# Patient Record
Sex: Female | Born: 1950 | Race: Black or African American | Hispanic: No | Marital: Married | Smoking: Never smoker
Health system: Southern US, Community
[De-identification: ages and names within clinical notes are randomized; demographics above are authoritative.]

## PROBLEM LIST (undated history)

## (undated) ENCOUNTER — Emergency Department (HOSPITAL_COMMUNITY): Discharge status: Against Medical Advice | Payer: Self-pay

## (undated) DIAGNOSIS — I10 Essential (primary) hypertension: Secondary | ICD-10-CM

## (undated) DIAGNOSIS — K219 Gastro-esophageal reflux disease without esophagitis: Secondary | ICD-10-CM

## (undated) DIAGNOSIS — M1990 Unspecified osteoarthritis, unspecified site: Secondary | ICD-10-CM

## (undated) DIAGNOSIS — M797 Fibromyalgia: Secondary | ICD-10-CM

## (undated) DIAGNOSIS — K56609 Unspecified intestinal obstruction, unspecified as to partial versus complete obstruction: Secondary | ICD-10-CM

## (undated) DIAGNOSIS — C801 Malignant (primary) neoplasm, unspecified: Secondary | ICD-10-CM

## (undated) DIAGNOSIS — Z95828 Presence of other vascular implants and grafts: Secondary | ICD-10-CM

## (undated) DIAGNOSIS — E119 Type 2 diabetes mellitus without complications: Secondary | ICD-10-CM

## (undated) HISTORY — PX: BACK SURGERY: SHX140

## (undated) HISTORY — PX: ABDOMINAL HYSTERECTOMY: SHX81

---

## 2006-03-30 ENCOUNTER — Inpatient Hospital Stay (HOSPITAL_COMMUNITY): Admission: RE | Admit: 2006-03-30 | Discharge: 2006-04-01 | Payer: Self-pay | Admitting: Specialist

## 2006-05-23 ENCOUNTER — Inpatient Hospital Stay (HOSPITAL_COMMUNITY): Admission: RE | Admit: 2006-05-23 | Discharge: 2006-05-28 | Payer: Self-pay | Admitting: Specialist

## 2008-02-08 DIAGNOSIS — Z95828 Presence of other vascular implants and grafts: Secondary | ICD-10-CM

## 2008-02-08 HISTORY — DX: Presence of other vascular implants and grafts: Z95.828

## 2008-02-19 ENCOUNTER — Encounter: Admission: RE | Admit: 2008-02-19 | Discharge: 2008-02-19 | Payer: Self-pay | Admitting: Specialist

## 2008-03-06 ENCOUNTER — Ambulatory Visit: Payer: Self-pay | Admitting: *Deleted

## 2008-04-29 ENCOUNTER — Inpatient Hospital Stay (HOSPITAL_COMMUNITY): Admission: RE | Admit: 2008-04-29 | Discharge: 2008-05-07 | Payer: Self-pay | Admitting: Specialist

## 2008-04-29 ENCOUNTER — Ambulatory Visit: Payer: Self-pay | Admitting: *Deleted

## 2008-05-05 ENCOUNTER — Ambulatory Visit: Payer: Self-pay | Admitting: Physical Medicine & Rehabilitation

## 2008-05-16 ENCOUNTER — Inpatient Hospital Stay (HOSPITAL_COMMUNITY): Admission: EM | Admit: 2008-05-16 | Discharge: 2008-05-26 | Payer: Self-pay | Admitting: Specialist

## 2008-05-17 ENCOUNTER — Encounter (INDEPENDENT_AMBULATORY_CARE_PROVIDER_SITE_OTHER): Payer: Self-pay | Admitting: Surgery

## 2008-10-14 ENCOUNTER — Encounter: Admission: RE | Admit: 2008-10-14 | Discharge: 2008-10-14 | Payer: Self-pay | Admitting: Specialist

## 2010-05-19 LAB — GLUCOSE, CAPILLARY
Glucose-Capillary: 119 mg/dL — ABNORMAL HIGH (ref 70–99)
Glucose-Capillary: 122 mg/dL — ABNORMAL HIGH (ref 70–99)
Glucose-Capillary: 131 mg/dL — ABNORMAL HIGH (ref 70–99)
Glucose-Capillary: 147 mg/dL — ABNORMAL HIGH (ref 70–99)
Glucose-Capillary: 169 mg/dL — ABNORMAL HIGH (ref 70–99)
Glucose-Capillary: 172 mg/dL — ABNORMAL HIGH (ref 70–99)
Glucose-Capillary: 173 mg/dL — ABNORMAL HIGH (ref 70–99)
Glucose-Capillary: 175 mg/dL — ABNORMAL HIGH (ref 70–99)
Glucose-Capillary: 186 mg/dL — ABNORMAL HIGH (ref 70–99)
Glucose-Capillary: 122 mg/dL — ABNORMAL HIGH (ref 70–99)
Glucose-Capillary: 138 mg/dL — ABNORMAL HIGH (ref 70–99)
Glucose-Capillary: 138 mg/dL — ABNORMAL HIGH (ref 70–99)
Glucose-Capillary: 147 mg/dL — ABNORMAL HIGH (ref 70–99)
Glucose-Capillary: 159 mg/dL — ABNORMAL HIGH (ref 70–99)
Glucose-Capillary: 160 mg/dL — ABNORMAL HIGH (ref 70–99)
Glucose-Capillary: 162 mg/dL — ABNORMAL HIGH (ref 70–99)
Glucose-Capillary: 187 mg/dL — ABNORMAL HIGH (ref 70–99)
Glucose-Capillary: 187 mg/dL — ABNORMAL HIGH (ref 70–99)
Glucose-Capillary: 214 mg/dL — ABNORMAL HIGH (ref 70–99)
Glucose-Capillary: 221 mg/dL — ABNORMAL HIGH (ref 70–99)

## 2010-05-19 LAB — CBC
HCT: 24.9 % — ABNORMAL LOW (ref 36.0–46.0)
HCT: 27.3 % — ABNORMAL LOW (ref 36.0–46.0)
HCT: 27.8 % — ABNORMAL LOW (ref 36.0–46.0)
HCT: 27.9 % — ABNORMAL LOW (ref 36.0–46.0)
HCT: 30.4 % — ABNORMAL LOW (ref 36.0–46.0)
HCT: 36 % (ref 36.0–46.0)
Hemoglobin: 8.4 g/dL — ABNORMAL LOW (ref 12.0–15.0)
Hemoglobin: 9.3 g/dL — ABNORMAL LOW (ref 12.0–15.0)
Hemoglobin: 9.5 g/dL — ABNORMAL LOW (ref 12.0–15.0)
Hemoglobin: 10.3 g/dL — ABNORMAL LOW (ref 12.0–15.0)
Hemoglobin: 10.4 g/dL — ABNORMAL LOW (ref 12.0–15.0)
Hemoglobin: 12 g/dL (ref 12.0–15.0)
MCHC: 34.2 g/dL (ref 30.0–36.0)
MCHC: 34.2 g/dL (ref 30.0–36.0)
MCHC: 33.2 g/dL (ref 30.0–36.0)
MCHC: 33.9 g/dL (ref 30.0–36.0)
MCHC: 34 g/dL (ref 30.0–36.0)
MCV: 87.8 fL (ref 78.0–100.0)
MCV: 88.4 fL (ref 78.0–100.0)
MCV: 88.7 fL (ref 78.0–100.0)
MCV: 87.8 fL (ref 78.0–100.0)
MCV: 88.4 fL (ref 78.0–100.0)
MCV: 89.7 fL (ref 78.0–100.0)
Platelets: 146 10*3/uL — ABNORMAL LOW (ref 150–400)
Platelets: 146 10*3/uL — ABNORMAL LOW (ref 150–400)
Platelets: 171 10*3/uL (ref 150–400)
Platelets: 252 10*3/uL (ref 150–400)
RBC: 2.8 MIL/uL — ABNORMAL LOW (ref 3.87–5.11)
RBC: 3.16 MIL/uL — ABNORMAL LOW (ref 3.87–5.11)
RBC: 3.17 MIL/uL — ABNORMAL LOW (ref 3.87–5.11)
RBC: 3.48 MIL/uL — ABNORMAL LOW (ref 3.87–5.11)
RBC: 3.53 MIL/uL — ABNORMAL LOW (ref 3.87–5.11)
RBC: 3.84 MIL/uL — ABNORMAL LOW (ref 3.87–5.11)
RBC: 4.01 MIL/uL (ref 3.87–5.11)
RDW: 15 % (ref 11.5–15.5)
RDW: 15.1 % (ref 11.5–15.5)
RDW: 15.5 % (ref 11.5–15.5)
WBC: 7.9 10*3/uL (ref 4.0–10.5)
WBC: 8.1 10*3/uL (ref 4.0–10.5)
WBC: 8.6 10*3/uL (ref 4.0–10.5)
WBC: 8.7 10*3/uL (ref 4.0–10.5)
WBC: 11.5 10*3/uL — ABNORMAL HIGH (ref 4.0–10.5)
WBC: 13.2 10*3/uL — ABNORMAL HIGH (ref 4.0–10.5)
WBC: 7.7 10*3/uL (ref 4.0–10.5)
WBC: 7.8 10*3/uL (ref 4.0–10.5)
WBC: 8.9 10*3/uL (ref 4.0–10.5)

## 2010-05-19 LAB — PROTIME-INR
INR: 1.3 (ref 0.00–1.49)
INR: 1.9 — ABNORMAL HIGH (ref 0.00–1.49)
Prothrombin Time: 15.4 seconds — ABNORMAL HIGH (ref 11.6–15.2)
Prothrombin Time: 16.7 seconds — ABNORMAL HIGH (ref 11.6–15.2)

## 2010-05-19 LAB — BASIC METABOLIC PANEL
BUN: 9 mg/dL (ref 6–23)
CO2: 26 mEq/L (ref 19–32)
CO2: 25 mEq/L (ref 19–32)
Calcium: 8.3 mg/dL — ABNORMAL LOW (ref 8.4–10.5)
Calcium: 8 mg/dL — ABNORMAL LOW (ref 8.4–10.5)
Chloride: 105 mEq/L (ref 96–112)
Creatinine, Ser: 0.59 mg/dL (ref 0.4–1.2)
Creatinine, Ser: 0.58 mg/dL (ref 0.4–1.2)
GFR calc Af Amer: >60 mL/min (ref >60)
GFR calc Af Amer: >60 mL/min (ref >60)
GFR calc Af Amer: >60 mL/min (ref >60)
GFR calc non Af Amer: >60 mL/min (ref >60)
GFR calc non Af Amer: >60 mL/min (ref >60)
Glucose, Bld: 169 mg/dL — ABNORMAL HIGH (ref 70–99)
Potassium: 4 mEq/L (ref 3.5–5.1)
Potassium: 4.6 mEq/L (ref 3.5–5.1)
Potassium: 3.5 mEq/L (ref 3.5–5.1)
Sodium: 138 mEq/L (ref 135–145)
Sodium: 138 mEq/L (ref 135–145)
Sodium: 139 mEq/L (ref 135–145)

## 2010-05-19 LAB — DIFFERENTIAL
Basophils Absolute: 0 10*3/uL (ref 0.0–0.1)
Basophils Absolute: 0.1 10*3/uL (ref 0.0–0.1)
Basophils Relative: 0 % (ref 0–1)
Basophils Relative: 0 % (ref 0–1)
Eosinophils Absolute: 0.1 10*3/uL (ref 0.0–0.7)
Eosinophils Absolute: 0.2 10*3/uL (ref 0.0–0.7)
Eosinophils Relative: 1 % (ref 0–5)
Lymphocytes Relative: 13 % (ref 12–46)
Monocytes Absolute: 0.7 10*3/uL (ref 0.1–1.0)
Monocytes Relative: 6 % (ref 3–12)
Monocytes Relative: 8 % (ref 3–12)
Neutro Abs: 10.8 10*3/uL — ABNORMAL HIGH (ref 1.7–7.7)
Neutro Abs: 9.8 10*3/uL — ABNORMAL HIGH (ref 1.7–7.7)
Neutrophils Relative %: 78 % — ABNORMAL HIGH (ref 43–77)

## 2010-05-19 LAB — COMPREHENSIVE METABOLIC PANEL
ALT: 40 U/L — ABNORMAL HIGH (ref 0–35)
ALT: 45 U/L — ABNORMAL HIGH (ref 0–35)
ALT: 63 U/L — ABNORMAL HIGH (ref 0–35)
AST: 19 U/L (ref 0–37)
AST: 24 U/L (ref 0–37)
AST: 27 U/L (ref 0–37)
AST: 44 U/L — ABNORMAL HIGH (ref 0–37)
Albumin: 2.3 g/dL — ABNORMAL LOW (ref 3.5–5.2)
Albumin: 2.8 g/dL — ABNORMAL LOW (ref 3.5–5.2)
Alkaline Phosphatase: 102 U/L (ref 39–117)
Alkaline Phosphatase: 120 U/L — ABNORMAL HIGH (ref 39–117)
Alkaline Phosphatase: 122 U/L — ABNORMAL HIGH (ref 39–117)
BUN: 10 mg/dL (ref 6–23)
CO2: 25 mEq/L (ref 19–32)
CO2: 25 mEq/L (ref 19–32)
CO2: 27 mEq/L (ref 19–32)
CO2: 27 mEq/L (ref 19–32)
Calcium: 8.6 mg/dL (ref 8.4–10.5)
Chloride: 101 mEq/L (ref 96–112)
Chloride: 101 mEq/L (ref 96–112)
Chloride: 108 mEq/L (ref 96–112)
Chloride: 110 mEq/L (ref 96–112)
Creatinine, Ser: 0.51 mg/dL (ref 0.4–1.2)
Creatinine, Ser: 0.56 mg/dL (ref 0.4–1.2)
Creatinine, Ser: 0.77 mg/dL (ref 0.4–1.2)
GFR calc Af Amer: >60 mL/min (ref >60)
GFR calc Af Amer: >60 mL/min (ref >60)
GFR calc Af Amer: >60 mL/min (ref >60)
GFR calc Af Amer: >60 mL/min (ref >60)
GFR calc non Af Amer: >60 mL/min (ref >60)
GFR calc non Af Amer: >60 mL/min (ref >60)
GFR calc non Af Amer: >60 mL/min (ref >60)
GFR calc non Af Amer: >60 mL/min (ref >60)
Glucose, Bld: 133 mg/dL — ABNORMAL HIGH (ref 70–99)
Glucose, Bld: 151 mg/dL — ABNORMAL HIGH (ref 70–99)
Glucose, Bld: 168 mg/dL — ABNORMAL HIGH (ref 70–99)
Potassium: 3.4 mEq/L — ABNORMAL LOW (ref 3.5–5.1)
Potassium: 3.4 mEq/L — ABNORMAL LOW (ref 3.5–5.1)
Potassium: 3.7 mEq/L (ref 3.5–5.1)
Sodium: 136 mEq/L (ref 135–145)
Sodium: 137 mEq/L (ref 135–145)
Total Bilirubin: 0.4 mg/dL (ref 0.3–1.2)
Total Bilirubin: 0.5 mg/dL (ref 0.3–1.2)
Total Bilirubin: 0.6 mg/dL (ref 0.3–1.2)
Total Bilirubin: 0.8 mg/dL (ref 0.3–1.2)
Total Protein: 6.6 g/dL (ref 6.0–8.3)

## 2010-05-19 LAB — LIPASE, BLOOD: Lipase: 25 U/L (ref 11–59)

## 2010-05-19 LAB — URINE MICROSCOPIC-ADD ON

## 2010-05-19 LAB — HEPARIN LEVEL (UNFRACTIONATED)
Heparin Unfractionated: 0.13 IU/mL — ABNORMAL LOW (ref 0.30–0.70)
Heparin Unfractionated: 0.2 IU/mL — ABNORMAL LOW (ref 0.30–0.70)
Heparin Unfractionated: 0.32 IU/mL (ref 0.30–0.70)
Heparin Unfractionated: 0.42 IU/mL (ref 0.30–0.70)
Heparin Unfractionated: 0.66 IU/mL (ref 0.30–0.70)
Heparin Unfractionated: <0.1 IU/mL — ABNORMAL LOW (ref 0.30–0.70)

## 2010-05-19 LAB — URINALYSIS, ROUTINE W REFLEX MICROSCOPIC
Bilirubin Urine: NEGATIVE
Glucose, UA: NEGATIVE mg/dL
Hgb urine dipstick: NEGATIVE
Nitrite: NEGATIVE
Specific Gravity, Urine: >1.046 — ABNORMAL HIGH (ref 1.005–1.030)
pH: 7 (ref 5.0–8.0)

## 2010-05-19 LAB — TRIGLYCERIDES: Triglycerides: 66 mg/dL (ref <150)

## 2010-05-19 LAB — TYPE AND SCREEN: Antibody Screen: NEGATIVE

## 2010-05-19 LAB — PHOSPHORUS
Phosphorus: 2.7 mg/dL (ref 2.3–4.6)
Phosphorus: 3.3 mg/dL (ref 2.3–4.6)

## 2010-05-20 LAB — GLUCOSE, CAPILLARY
Glucose-Capillary: 103 mg/dL — ABNORMAL HIGH (ref 70–99)
Glucose-Capillary: 107 mg/dL — ABNORMAL HIGH (ref 70–99)
Glucose-Capillary: 119 mg/dL — ABNORMAL HIGH (ref 70–99)
Glucose-Capillary: 128 mg/dL — ABNORMAL HIGH (ref 70–99)
Glucose-Capillary: 135 mg/dL — ABNORMAL HIGH (ref 70–99)
Glucose-Capillary: 135 mg/dL — ABNORMAL HIGH (ref 70–99)
Glucose-Capillary: 136 mg/dL — ABNORMAL HIGH (ref 70–99)
Glucose-Capillary: 136 mg/dL — ABNORMAL HIGH (ref 70–99)
Glucose-Capillary: 137 mg/dL — ABNORMAL HIGH (ref 70–99)
Glucose-Capillary: 137 mg/dL — ABNORMAL HIGH (ref 70–99)
Glucose-Capillary: 138 mg/dL — ABNORMAL HIGH (ref 70–99)
Glucose-Capillary: 145 mg/dL — ABNORMAL HIGH (ref 70–99)
Glucose-Capillary: 145 mg/dL — ABNORMAL HIGH (ref 70–99)
Glucose-Capillary: 151 mg/dL — ABNORMAL HIGH (ref 70–99)
Glucose-Capillary: 73 mg/dL (ref 70–99)

## 2010-05-20 LAB — TYPE AND SCREEN: Antibody Screen: NEGATIVE

## 2010-05-20 LAB — CBC
HCT: 27.6 % — ABNORMAL LOW (ref 36.0–46.0)
HCT: 27.7 % — ABNORMAL LOW (ref 36.0–46.0)
HCT: 29 % — ABNORMAL LOW (ref 36.0–46.0)
HCT: 30.6 % — ABNORMAL LOW (ref 36.0–46.0)
Hemoglobin: 11 g/dL — ABNORMAL LOW (ref 12.0–15.0)
Hemoglobin: 9.3 g/dL — ABNORMAL LOW (ref 12.0–15.0)
Hemoglobin: 9.5 g/dL — ABNORMAL LOW (ref 12.0–15.0)
MCHC: 34.4 g/dL (ref 30.0–36.0)
MCHC: 35.3 g/dL (ref 30.0–36.0)
MCV: 87.8 fL (ref 78.0–100.0)
MCV: 88 fL (ref 78.0–100.0)
MCV: 90.9 fL (ref 78.0–100.0)
Platelets: 130 10*3/uL — ABNORMAL LOW (ref 150–400)
Platelets: 130 10*3/uL — ABNORMAL LOW (ref 150–400)
Platelets: 202 10*3/uL (ref 150–400)
Platelets: 251 10*3/uL (ref 150–400)
RBC: 3.09 MIL/uL — ABNORMAL LOW (ref 3.87–5.11)
RBC: 3.15 MIL/uL — ABNORMAL LOW (ref 3.87–5.11)
RBC: 3.28 MIL/uL — ABNORMAL LOW (ref 3.87–5.11)
RBC: 3.36 MIL/uL — ABNORMAL LOW (ref 3.87–5.11)
RBC: 3.54 MIL/uL — ABNORMAL LOW (ref 3.87–5.11)
RDW: 12.7 % (ref 11.5–15.5)
RDW: 13.2 % (ref 11.5–15.5)
RDW: 14.8 % (ref 11.5–15.5)
RDW: 15 % (ref 11.5–15.5)
WBC: 12.3 10*3/uL — ABNORMAL HIGH (ref 4.0–10.5)
WBC: 12.4 10*3/uL — ABNORMAL HIGH (ref 4.0–10.5)
WBC: 12.4 10*3/uL — ABNORMAL HIGH (ref 4.0–10.5)
WBC: 8.9 10*3/uL (ref 4.0–10.5)
WBC: 9.3 10*3/uL (ref 4.0–10.5)

## 2010-05-20 LAB — COMPREHENSIVE METABOLIC PANEL
ALT: 15 U/L (ref 0–35)
ALT: 22 U/L (ref 0–35)
AST: 21 U/L (ref 0–37)
AST: 21 U/L (ref 0–37)
AST: 22 U/L (ref 0–37)
Albumin: 1.9 g/dL — ABNORMAL LOW (ref 3.5–5.2)
Alkaline Phosphatase: 113 U/L (ref 39–117)
BUN: 10 mg/dL (ref 6–23)
CO2: 28 mEq/L (ref 19–32)
Calcium: 7.7 mg/dL — ABNORMAL LOW (ref 8.4–10.5)
Chloride: 102 mEq/L (ref 96–112)
Chloride: 104 mEq/L (ref 96–112)
Creatinine, Ser: 0.72 mg/dL (ref 0.4–1.2)
GFR calc Af Amer: 59 mL/min — ABNORMAL LOW (ref >60)
GFR calc Af Amer: >60 mL/min (ref >60)
GFR calc Af Amer: >60 mL/min (ref >60)
GFR calc non Af Amer: 49 mL/min — ABNORMAL LOW (ref >60)
Glucose, Bld: 141 mg/dL — ABNORMAL HIGH (ref 70–99)
Glucose, Bld: 84 mg/dL (ref 70–99)
Sodium: 135 mEq/L (ref 135–145)
Sodium: 140 mEq/L (ref 135–145)
Total Bilirubin: 0.4 mg/dL (ref 0.3–1.2)
Total Protein: 4 g/dL — ABNORMAL LOW (ref 6.0–8.3)
Total Protein: 5.3 g/dL — ABNORMAL LOW (ref 6.0–8.3)

## 2010-05-20 LAB — BASIC METABOLIC PANEL
BUN: 8 mg/dL (ref 6–23)
CO2: 26 mEq/L (ref 19–32)
CO2: 27 mEq/L (ref 19–32)
Calcium: 7.6 mg/dL — ABNORMAL LOW (ref 8.4–10.5)
Calcium: 7.6 mg/dL — ABNORMAL LOW (ref 8.4–10.5)
Calcium: 7.7 mg/dL — ABNORMAL LOW (ref 8.4–10.5)
Chloride: 102 mEq/L (ref 96–112)
Chloride: 103 mEq/L (ref 96–112)
Chloride: 106 mEq/L (ref 96–112)
Creatinine, Ser: 0.68 mg/dL (ref 0.4–1.2)
Creatinine, Ser: 0.78 mg/dL (ref 0.4–1.2)
Creatinine, Ser: 0.78 mg/dL (ref 0.4–1.2)
GFR calc Af Amer: >60 mL/min (ref >60)
GFR calc Af Amer: >60 mL/min (ref >60)
GFR calc non Af Amer: >60 mL/min (ref >60)
GFR calc non Af Amer: >60 mL/min (ref >60)
Glucose, Bld: 123 mg/dL — ABNORMAL HIGH (ref 70–99)
Glucose, Bld: 221 mg/dL — ABNORMAL HIGH (ref 70–99)
Potassium: 3.8 mEq/L (ref 3.5–5.1)
Potassium: 4 mEq/L (ref 3.5–5.1)
Sodium: 133 mEq/L — ABNORMAL LOW (ref 135–145)
Sodium: 135 mEq/L (ref 135–145)
Sodium: 139 mEq/L (ref 135–145)

## 2010-05-20 LAB — DIFFERENTIAL
Basophils Absolute: 0.1 10*3/uL (ref 0.0–0.1)
Basophils Absolute: 0.1 10*3/uL (ref 0.0–0.1)
Basophils Relative: 0 % (ref 0–1)
Basophils Relative: 1 % (ref 0–1)
Eosinophils Absolute: 0.2 10*3/uL (ref 0.0–0.7)
Eosinophils Relative: 2 % (ref 0–5)
Monocytes Relative: 6 % (ref 3–12)
Neutro Abs: 8.4 10*3/uL — ABNORMAL HIGH (ref 1.7–7.7)
Neutrophils Relative %: 56 % (ref 43–77)
Neutrophils Relative %: 78 % — ABNORMAL HIGH (ref 43–77)

## 2010-05-20 LAB — CROSSMATCH: Antibody Screen: NEGATIVE

## 2010-05-20 LAB — URINE MICROSCOPIC-ADD ON

## 2010-05-20 LAB — PROTIME-INR: Prothrombin Time: 13.5 seconds (ref 11.6–15.2)

## 2010-05-20 LAB — URINALYSIS, ROUTINE W REFLEX MICROSCOPIC
Bilirubin Urine: NEGATIVE
Glucose, UA: NEGATIVE mg/dL
Hgb urine dipstick: NEGATIVE
Hgb urine dipstick: NEGATIVE
Ketones, ur: NEGATIVE mg/dL
Specific Gravity, Urine: 1.022 (ref 1.005–1.030)
Urobilinogen, UA: 1 mg/dL (ref 0.0–1.0)
pH: 6 (ref 5.0–8.0)

## 2010-05-20 LAB — POCT I-STAT 7, (LYTES, BLD GAS, ICA,H+H)
HCT: 22 % — ABNORMAL LOW (ref 36.0–46.0)
pCO2 arterial: 37 mmHg (ref 35.0–45.0)
pH, Arterial: 7.465 — ABNORMAL HIGH (ref 7.350–7.400)

## 2010-05-20 LAB — HEMOGLOBIN AND HEMATOCRIT, BLOOD
HCT: 24.8 % — ABNORMAL LOW (ref 36.0–46.0)
Hemoglobin: 8.6 g/dL — ABNORMAL LOW (ref 12.0–15.0)

## 2010-05-20 LAB — URINE CULTURE

## 2010-05-20 LAB — GRAM STAIN

## 2010-05-20 LAB — HEMOGLOBIN A1C: Hgb A1c MFr Bld: 6 % (ref 4.6–6.1)

## 2010-05-20 LAB — ANAEROBIC CULTURE

## 2010-05-20 LAB — HEMATOCRIT: HCT: 24.3 % — ABNORMAL LOW (ref 36.0–46.0)

## 2010-06-22 NOTE — Op Note (Signed)
NAMEDELYNN, OLVERA NO.:  [id number]   MEDICAL RECORD NO.:  [id number]          PATIENT TYPE:  INP   LOCATION:  5154                         FACILITY:  [facility]   PHYSICIAN:  Velora Heckler, MD      DATE OF BIRTH:  [birth year]   DATE OF PROCEDURE:  05/22/2008  DATE OF DISCHARGE:                               OPERATIVE REPORT   PREOPERATIVE DIAGNOSIS:  Small-bowel obstruction.   POSTOPERATIVE DIAGNOSIS:  Small-bowel obstruction.   PROCEDURES:  1. Diagnostic laparoscopy.  2. Laparoscopic lysis of adhesions.   SURGEON:  Velora Heckler, MD, FACS   ASSISTANT:  Gabrielle Dare. Janee Morn, MD, and Kelle Darting. Rennis Harding, NP.   ANESTHESIA:  General per Dr. Claybon Jabs.   ESTIMATED BLOOD LOSS:  Minimal.   PREPARATION:  ChloraPrep.   COMPLICATIONS:  None.   INDICATIONS:  The patient is a 60 year old black female who had  undergone preperitoneal approach to anterior spine fusion by Dr. Vira Browns 3 weeks ago.  Postoperatively, she developed signs and symptoms of  small-bowel obstruction.  Despite conservative measures, she failed to  resolve.  The patient now comes to the operating room for laparoscopy  and management of small-bowel obstruction.   BODY OF REPORT:  Procedure was done in OR #70 at [facility].  The patient was brought to the operating room, placed in  supine position on the operating room table.  Following administration  of general anesthesia, the patient was positioned and then prepped and  draped in usual strict aseptic fashion.  After ascertaining that an  adequate level of anesthesia had been achieved, a 2-cm incision was made  in the right upper quadrant of the abdominal wall.  Using an OptiVu  trocar, the laparoscope was advanced into the peritoneal cavity under  direct vision.  Abdomen was then insufflated with carbon dioxide.  Laparoscope was introduced and the abdomen was explored.  Operative  ports were placed in the right lower  quadrant and suprapubic position  with 5-mm trocars.  There was a defect in the peritoneum on the left  side of the mid to lower abdomen.  Omentum was incarcerated and adhesed.  This was gently taken down with blunt dissection.  Beneath this defect  was a second defect just lateral to the descending colon.  This  contained incarcerated small bowel.  Using the EndoShears, the  peritoneum was incised and the band across the small bowel was released.  The small bowel loop was in the preperitoneal space.  It was reduced  back within the peritoneal cavity.  The remainder of the small bowel was  then freed from the edge of the peritoneum with sharp dissection with  the EndoShears providing complete release of the small bowel from what  was essentially an internal hernia through the peritoneum.  The  peritoneum was then re-affixed to the abdominal wall with several  [employer] titanium ProTacks.  Representative photographs were made for the  medical record.   Small bowel was run between [place] clamps and no evidence of  enterotomy was identified.  Left colon appears normal.  Fluid was  evacuated.  Pneumoperitoneum was released.  Ports were removed under  direct vision and good hemostasis was noted at all port sites.  Ports  were removed.  Wounds were anesthetized with local anesthetic.  Skin  edges were reapproximated with interrupted 4-0 Vicryl subcuticular  sutures.  Wounds were washed and dried and Benzoin and Steri-Strips were  applied.  Sterile dressings were applied.  The patient was awakened from  anesthesia and brought to the recovery room in stable condition.  The  patient tolerated the procedure well.      Velora Heckler, MD  Electronically Signed     TMG/MEDQ  D:  05/22/2008  T:  05/23/2008  Job:  [id number]   cc:   [name], M.D.  [name], M.D.

## 2010-06-22 NOTE — Op Note (Signed)
[name], [name]            ACCOUNT NO.:  [id number]   MEDICAL RECORD NO.:  [id number]          PATIENT TYPE:  INP   LOCATION:  3312                         FACILITY:  [facility]   PHYSICIAN:  [name], M.D.   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  Degenerative  disk disease L3-4 and L5-S1.   POSTOPERATIVE DIAGNOSES:  Nonunion transforaminal lumbar interbody  fusion L4-5 with loosening of posterior pedicle screws and rods,  degenerative disk disease L3-4 and L5-S1 with lateral recess stenosis  and foraminal entrapment at the L3-4 and L4-5 levels.  Abundant  posterior scar material.  The patient's anterior transforaminal lumbar  interbody fusion PEEK cage had displaced anteriorly and with supine  position reduced allowing for removal anteriorly.  Over time erosion  over the superior anterior lip of L5 had occurred.   PROCEDURE:  Basically, anterior and posterior procedures of the lumbar  spine.  Anterior lumbar interbody fusion performed with removal of a  PEEK cage anteriorly with takedown of nonunion and anterior lumbar  interbody fusion using a 19-mm Synthes PEEK cage with combination of  left iliac crest bone graft harvested through a separate incision,  Infuse small kit one-half of one gel pad with Vitoss and bone marrow  aspirate from the left anterior iliac crest.  Internal fixation of the  ALIF cage using a 25-mm 6.5 cancellous screw with single washer into the  superior endplate of L5 holding the cage in place.  Tisseel used to seal  off the anterior aspect of the anterior interbody fusion site from  retroperitoneum.  VAC application to the anterior lumbar incision site  as well as the anterior iliac crest bone graft harvest site incision.  Second portion of the procedure, a revision of posterolateral fusion L4-  5 with revision of hardware at the L4-L5 level, extension of fusion, and  hardware to the L3-4 level and L5-S1 level.  Posterolateral fusion L3-S1  using a combination of left iliac crest bone graft harvested through  previous separate incision anteriorly.  Local bone graft and Vitoss  charge with bone marrow aspirated from the left S1 pedicle and vertebral  body of S1.  Infuse one and a half gel pads from the small kit used for  posterolateral fusion.  Posterior instrumentation from L3-S1 using  Monarch pedicle screws and rods, 85-mm length rods, pedicle screws at  L3, L4, L5, and S1 with transverse loading rod a 7 length.  Redo central  laminectomy at L4-5 and at L3-4.  Tisseel applied to the areas of the  redo central laminectomy for hemostasis purposes.  VAC applied to the  posterior lumbar incision site.   SURGEON:  [name], MD.  Exposure by, [name], MD   ASSISTANT:  Wende Neighbors, PA   ANESTHESIA:  General via orotracheal intubation, Dr. Sondra Come and Dr.  Krista Blue.   FINDINGS:  As above.   SPECIMENS:  A culture and Gram stain was obtained from the anterior  lumbar interbody fusion site, the site of nonunion to ensure no  infection present.  Otherwise, findings as above.   ESTIMATED BLOOD LOSS:  1200 mL.  400 mL during the anterior lumbar  interbody fusion and 800 mL posterior lumbar procedures.  The patient  received 300 mL of autologous Cell Saver blood and 250 mL of autologous  blood.   COMPLICATIONS:  None.  The patient returned to the PACU in good  condition.   DRAINS:  Hemovac right lower lumbar x1.  Hemovac to close suction.  Foley to straight drain.  VAC anterior abdominal incision site and  left  iliac crest bone graft harvest site x1 and VAC to posterior lumbar  incision site x1.   HISTORY OF PRESENT ILLNESS:  This patient is a 60 year old female who  has undergone previous  decompressive laminectomy for severe lumbar  spinal stenosis.  She went on to develop severe slip at the L4-5 level  within months of undergoing decompression, and was returned to the  operating room almost 2 years ago at which time she underwent a redo  decompression with arthrodesis using a PEEK spine concepts of TLIF with  local bone graft and posterior instrumentation using spine concept  pedicle screws and rods.  She did well for a period of time and then  developed gradual loosening of her implants, finally resulting in near  complete extrusion of PEEK TLIF implant anteriorly.  She has been  followed conservatively over the last 2 years.  Postoperatively, had  difficulties with wound healing, wound dehiscence, and over the course  of 8 months postoperatively, with serial dressing changes,eventually  went on to heal her posterior lumbar surgery site.  She has persisted  with pain in her back, radiation into her legs, difficulty with any  standing and walking.  She has been followed also for degenerative  changes in her knees and severe knee pain.  Her postoperative studies  have shown difficulties with healing the TLIF at the L4-5 level with  development of a nonunion and pseudoarthrosis anteriorly.  Her myelogram  and MRI studies have shown severe degenerative disk changes at L3-4, the  area of previous nonunion of TLIF at L4-5 with a great deal of  inflammatory tissue around the disk space.  Degenerative disk disease L5-  S1.  Preoperatively, she underwent a diskography which showed 6/10 of  pain at the L2-3 and L3-4 levels, 9/10 with injection at L5-S1.  She was  felt to have mild degenerative disk disease at L2-3, severe degenerative  disk disease at L3-4 and L5-S1.  Because of persistent severe pain  requiring continued use of narcotic medicines, findings of nonunion of  the TLIF at L4-5 nearly 2 years following her surgery and loosening of  her hardware, it was felt that she was  a candidate to undergo an  anterior removal of cage in L4-5 with L3 arthrodesis posteriorly  extending the fusion to the L3 and S1 levels in order to allow for  better purchase posteriorly with instrumentation here. Use of Infuse  BMP.  Cage was felt to be anterior to the disk space at L4-5; therefore,  would require resection via an anterior approach, so an ALIF following  this resection was felt to be necessary.  In addition to this, ALIF was  contemplated for L5-S1.  At the time of surgery, the L5-S1 anterior disk  was found to be calcified fully with only  a very small area over the  left side that showed a small opening, though the Nicholos Johns could be  placed to allow for radiographs here.   DESCRIPTION OF PROCEDURE:  After adequate general anesthesia and the  patient in supine position, all pressure points well padded.  The Foley  catheter was placed, standard preoperative antibiotics, 2 g of Ancef.  The patient's anterior left abdomen marked preoperatively in the preop  holding area.  All pressure points well padded.  The [place] flat spine  table was used.  No bolster for the lower abdomen as C-arm fluoro  demonstrated the disk spaces at both L5-S1 and L4-5 to be accessible  without any significant need to tilt pelvis.  The patient had standard  prep with DuraPrep solution from the xyphoid process transversely to the  pubis was draped in the usual manner using iodine Vi-drape.  Left iliac  crest was prepped and draped with DuraPrep solution and draped as well,  iodine Vi-drape was used.  Dr. Liliane Bade performed the anterior lumbar  exposure via an oblique left paramedian incision at the level of the L5-  S1 levels just lateral to the umbilicus extending down to the pubis.  Incision was through skin and subcutaneous layers directly to the  anterior rectus.  Anterior rectus sheath was incised in line with the  rectus muscle medially and small perforating vessels were individually   cauterized.  Rectus muscle then retracted laterally and the arcuate  ligament identified.  The peritoneum and its contents were gradually  bluntly retracted medially and the lateral wall of the abdomen obtained.  A small amount of the arcuate ligament and 2.5-3 cm was taken down in  order to allow for further mobilization of the patient's peritoneal  contents and peritoneal sac laterally to the right side, and the psoas  muscle was encountered, identified.  Genitofemoral nerve identified and  carefully protected.  The ureter identified and protected as well.  The  patient then had careful mobilization of the soft tissue between the  common iliac arteries after their bifurcation in order to expose the  anterior prominence of the sacrum and the L5-S1 level.  Dr. [name]  performed this using blunt dissection where he preserved sympathetic and  parasympathetics here.  After careful exposure in the L5-S1 level,  mobilization of the great vessels and the left common iliac vein was  carried out in order to expose the L4-5 level.  The ascending  iliofemoral vein was identified and suture ligated and clipped and  divided to allow for further mobilization of the vessels to the right  side for exposure of the L4-5 level.  Segmental vessels above this level  were also identified and suture ligated and clipped in order to mobilize  the great vessels to the right side and expose the anterior aspect of  the L4-5 disk space, and this was done quite nicely.  A self-retaining  retractor was then inserted and the exposure obtained at the L4-5 level.  Carefully, soft tissue about the TLIF implant was mobilized using #15  blade scalpel and the cage was grasped using a long Kocher clamp.  Soft  tissue was then debrided away from the implant and the implant removed  without difficulty.  The patient then had curettage of the disk space at  the L4-5 level with excision of the anterior longitudinal ligament and  the  anterior disk material.  Curettage of both the inferior endplate of  the L4 and superior endplate  of L5 was carried out.  Anterior superior  aspect of L5 was noted to be rounded by apparent previous nonunion and  previous cage at this level.  Curettage was carried down to the bleeding  bone endplates of the superior aspect of L5 and inferior aspect of L4.  The material removed was mucoid and fibrous, and was sent for Gram  stain, culture, and sensitivity.  Stat Gram stain indicated that there  was no significant organisms seen with polys and mononuclear cells  noted.  Following debridement of the endplates back to the posterior lip  of the posterior superior aspect of L5 and posterior superior aspect of  the inferior aspect of L4 within the superior displaced trial cage was  performed 15 fitted loosely, 17 also fitted loosely, 19 is the best fit  of cage anteriorly, 19-mm cage was chosen.  A separate incision was then  made over the left iliac crest through the skin and subcutaneous layers  using a #10 blade scalpel and to the left anterior lateral iliac crest.  Self-retaining retractors first cerebellar and then Taylor retractor,  after incision of fascia overlying the iliac crest and subperiosteal  dissection exposing the lateral aspect of the left iliac crest.  Half-  inch osteotomes were then used to remove lateral table approximately 3.5  cm x 2.5 cm of rectangle cortical bone was removed and then cancellous  strips were removed.  Additional bone graft was obtained using  curettage.  Bone marrow was then harvested by placing a Jamshidi-type  needle or bone marrow aspirate needle into the iliac crest between the  tables from anterior posterior and 10 mL of bone marrow aspirate was  obtained charging a 10 mL strip of Vitoss.  Infuse using a small kit was  obtained.  The small kit approximately 3 mg and 2 strips, and one strip  was cut in half.  Bone graft was then placed into the  inferior one half  of the cage centrally and Vitoss, and then Infuse material, Vitoss and  then additional bone graft topping off this sandwich of a composite type  graft material within the cage measuring 19-mm height.  Cage was then  placed in the anterior aspect of the disk space after first using C-arm  to ensure that the trial appeared to be in good position and alignment  in AP and lateral planes.  Cage was then introduced impacted into place  subset beneath the anterior aspect of the disk space and anterior lips  of L4-5.  Additional Vitoss was then packed along the sides of the graft  both the left side, right side and over the anterior superior aspect of  the graft.  A single 25-mm length 6.5 cancellous screw with washer was  then used to fix the inferior aspect of the cage within the disk space  placing the screw first using an awl to make an entry point into the  superior endplate of L5 obliquely directed posteriorly and then placing  the cancellous screw obtaining excellent fixation of the cage, and  compressing it to the superior endplate of L5.  Care was taken not to  retropulse the cage with insertion of the screw.  Following this,  irrigation was carried out in the entire abdomen.  Exploration of the L5-  S1 disk demonstrated that hard calcific osteophyte had bridged the  anterior aspect of this disk space, so that it was felt that anterior  lumbar interbody fusion was not necessary.  The  posterior  instrumentation and posterior lateral fusion will be adequate to treat  this segment.  With this, then careful irrigation and drying of the  anterior ALIF side at L4-5, Tisseel that had been mixed previously was  then placed over the anterior lumbar interbody fusion site to seal off  the area of Infuse.  Irrigation was then carried out of the abdomen and  the perineal sac was then allowed to re-expand to the left side, all  retractors were removed.  The arcuate ligament was felt  to not require  approximation as it was such a small amount that remained detached.  The  anterior rectus muscle allowed to fall back into place and then the  anterior fascial layer of the rectus muscle was closed with a running  suture of 0 Vicryl stitch locked in place, sutured tied at the end.  Dr. Tawanna Cooler Early observed, felt this to be fine.  Irrigation of the anterior  subcu layers was then carried out and closure of the deep fatty layers  with interrupted 0 Vicryl sutures, more superficial layers with  interrupted 2-0 Vicryl sutures, and the skin closed with stainless steel  staples.  Left iliac crest bone graft harvest site was carefully  irrigated.  The fascial layer approximated over the iliac crest with a  running stitch of #1 Vicryl.  The fatty layers approximated with  interrupted #1 Vicryl sutures, more superficial layers with interrupted  2-0 Vicryl sutures, skin closed with stainless steel staples.  VAC was  then applied to both the anterior lumbar incision site and left iliac  crest bone harvest site.  Intraoperative radiograph obtained  demonstrating no presence of sponge within the abdomen.  AP and lateral  C-arm demonstrating the ALIF in good position alignment.  At this point,  the patient received additional antibiotic dosage.  Note that  intraoperative time-out had been carried out prior to the abdominal  incision demonstrating the procedure performed with this patient and who  the patient was and performing surgeons with any expected concerns.   Attention was then turned to the posterior lumbar fusion.  This  procedure is a separate operation.  The patient on the [facility] spine  frame in supine position.  A prone portion of the table was then placed  over the patient's anterior body and secured to the frame for rotation  of the patient to a prone position.  Multiple straps were applied to  keep the table intact during the rotation based on this procedure.  All   pressure points were well padded.  Pads placed against the iliac crest  and over the thighs.  VAC was in charge position of 100-mm.  At this  point, the patient was then rotated to a prone position with 4 postures  intact.  The arm was brought to the side and all pressure points well  padded, arms at 90 degrees.  Intraoperative neuro monitoring  demonstrated no significant changes during the anterior procedure as  well as during the monitor changed from a supine position to prone  position.  Carefully, each of the pads were adjusted and pressure taken  off those areas where the patient's VAC's were in place.  PAS stockings  were used during the anterior procedure and remained in place for the  posterior procedure as well.  The Cell Saver blood collector machine was  kept for both first and second parts of the case.  This noted that the  patient had received a second dose  of intraoperative antibiotics.  She  then underwent prep of the posterior lumbar spine extending from the  lumbodorsal junction to the mid sacral level using DuraPrep solution.  She was draped in the usual manner, iodine Vi-drape was used.  Incision  ellipsing the old incision scar to the midline extending from about L1-  S2.  Through the skin and subcutaneous layers, the posterior spinous  process of L2, L3, L4 were identified as well as L5-S1.  The L4 spinous  process was missing and had been removed with previous surgeries.  Careful dissection along the lateral aspects of the spinous process of  residual of L3, L2, L1 superiorly, L5, S1, S2 inferiorly exposing the  posterior aspect of the lamina of each level.  Self-retaining retractors  were inserted.  The central lamina and laminotomy area at L4-5 carefully  exposed so as to not enter this area.  The patient then had dissection  carried out laterally.  The patient's bilateral facet capsules at the L2-  3 level were carefully preserved, L3-4 were resected bilaterally.   Then,  exposure carried out to the posterior instrumentation bilaterally at L4-  L5.  The L5-S1 facets identified bilaterally and exposure obtained out  laterally over the sacral ala both sides at S1 level.  Viper retractor  was placed.  Soft tissue removed from about the posterior  instrumentation using Frankey Poot rongeurs as well as Corporate investment banker.  Leksell rongeur used to remove the inferior aspect of the one half of  the L3 spinous process down to its base and then a small portion of the  inferior aspect lamina of L3 bilaterally.  A 3-mm Kerrison then used to  enter inferior to the lamina of L3, remove, resect bone in this level,  and a residual ligamentum flavum was resected from the ventral insertion  into the inferior aspect lamina of L3 on both sides.  This was carried  to the lateral recess of L3, both sides were abundant thickened  ligamentum flavum, the spurs were found be present impinging on the  lateral aspect of thecal sac, and the entry point of the L4 nerve root  both sides.  These were decompressed using loupe magnification headlamp  and 4-mm Kerrisons.  The abundant scar tissue over the posterior aspect  of the previous laminotomy site was carefully thinned using #15 blade  scalpel retraction as well as retraction on the scar tissue and finding  a plane between the scar tissue and underlying thecal sac with Nicholos Johns  four.  Spinous process of L5 was resected over superior aspect down to  the superior portion of the lamina and superior aspect of L5, and then 3-  mm Kerrison used to resect the superior portion of the lamina of L5 in  order to decompression of the thecal sac at this level, perform  foraminotomy over both L5 nerve roots, and this was completed without  difficulty.  Hockey-stick NeoProbe could then be passed out both L5  neural foramen demonstrating their patency and no sign of further nerve  compression at this segment.  Additionally, foraminotomy was then   carried out over both L4 nerve roots using osteotomes to resect the  medial L3-4 facets left side and right side, and the pars area overlying  the L4 nerve root, and then decompressed with 3-mm Kerrisons out the L4  neural foramen.  Total nerve root was found to be completely free.  Hockey-stick NeoProbe could then be passed out the L4 neural foramen  both  sides without difficulty.  The caps where the previous spine  concept hardware then carefully removed using a cap insertion device and  then turned counterclockwise to remove each cap.  Each of the rods were  then easily removed and then the insertion device used for placement of  screws were then used to remove the screws at the L4-L5 level.  Each of  the screws were then measured for length measuring 6.5-mm x 40-mm on the  right side and 35-mm on the left side.  Similar screws were to be used  for these levels, 1-mm thicker, 7.75-mm DePuy screws with 40-mm length  on the right side, 40-mm length on the left side at L4, and 35-mm length  on the left side at L5.  Each of the screw holes were carefully debrided  of any soft tissue interposed.  Attempts were made to identify the  transverse process at these levels.  It was difficult to find any.  Any  areas of possible bone bed were obtained over the posterior aspect of  the facet at L3-4 over the lateral aspect of the superior articular  process of L3, transverse processes of L3, the posterior aspect of the  residual facet of L4-5, and over the L5-S1 facets bilaterally, and sacral ala bilaterally, all decorticated.  Using C-arm fluoro, an entry  point was made into the intersection of the transverse process of L3  with the lateral aspect of pedicle of L3 observed on the AP and lateral  views to be in good position alignment, so then the pedicle probe was  used to probe the pedicle L3 both sides without difficulty.  These were  probed to a depth 40 mm and a 40-mm x 5.5 screw was chosen  because of  the small nature of this pedicle seen on CT intraoperatively.  Tapping  performed using a 4.75 tap, decortication of the transverse process, and  then, the screws were placed at the L3 level.  Note that, between each  step ball-tipped probe was used to probe the opening a channel into the  pedicle centrally, identifying, and ensuring no broaching of cortex  medial, lateral, inferior, superior.  These screws placed and revision  screws were placed at the L4-L5 levels.  As noted previously, 40 mm on  the left and right side at L4 by 7.75 tapping with the 7.0 tap first.  Ball-tipped probe used to ensure patency in the placing of the screws.  Good purchase on the right side and left side at L4 and L5.  On the left  side, no difficulty obtaining good purchase with a 7.0 x 35 screw, on  the right side, excellent purchase with a 40-mm screw x 7.75.  At the S1  levels, bur was used to decorticate the facet at the L5-S1 opening using  an awl and C-arm fluoro to ascertain correct position alignment of S1  below the superior endplate of S1 and then a pedicle probe used to probe  pedicle channel on the left side with 30-mm tapping with a 6.25 tap.  This did manage to capture the anterior cortex of left at the S1 level  and 35-mm x 7.0 screw was placed at this level in order to allow for  better alignment and positioning of screws on the left side rod.  Then  on the right side similarly, a 30-mm x 7.0 screw was placed at the S1  level using an awl to make an additional entry point verifying with  a C-  arm fluoro, then pedicle probe at 30 mm, tapping with a 6.25 tap placing  the 7.0.  Note that, prior to the placement of the left sacral screw,  aspiration of bone marrow was carried out 10 mL.  Bone marrow aspirate  was used to charge a 10 mL strip of Vitoss.  This Vitoss was extended,  transverse process of L3 to that of L4 and then L5 and to the sacral  ala.  Each of these were placed prior  to placement of the screws at each  level.  With this then, strips of Infuse that have been carried over  from the anterior procedure were then placed in the posterior lateral  fusion areas extending from L4-L5 and to S1 both sides.  Additional  Vitoss was used to hold these in place.  Testing of the intraoperative  pedicle screw with soft tissue resistance and carried out.  The lowest  triggers at the L4-L5 levels measuring between 20 and 27.  The L3 level  on the left 56, right 46, sacral level resistance was higher and can be  measured in both left and right side.  Each of the fasteners were then  carefully loosened to allow for acceptance of a rod.  An 85-mm pre-  contoured rod was then placed into the fastener on each side for screws.  Caps were then applied without difficulty using the rocker only once on  the left S1 level without difficulty.  With this completed, the patient  had torquing of the fastener cap at the L3 level bilaterally at 80 foot  pounds and then at the L4 level bilaterally is a neutral with no  compression to 80 foot pounds.  Compression between the caps at L4-L5,  fastener of L4-L5, and the fastener at L5 then attached to the rod at 80  foot pounds, left side then right side.  This decompressed the graft  anteriorly to restore lordosis.  Additional compression between the S1  fastener and that of the L4 fastener, and the cap passed onto 80 foot  pounds at the S1 level bilaterally.  Additional amount of spinous  process of L5 was resected to its base to allow for positioning of the  transverse loading rod at the L5 level.  This loading rod was placed  between the L5-S1 pedicle screws, was then a 7 rod that was easily  fastened into place and torqued to connect the hooks at each side of the  rods bilaterally, and the central lengthening that was then tightened to  80 foot pounds as well.  This completed the instrumentation.  AP and  lateral views on C-arm fluoro  were then obtained to document the  position alignment of hardware.  Additional bone graft was then  harvested from the iliac crest locally, it was then placed  posterolaterally over the Infuse extending from L3-S1 bilaterally.  Irrigation was carried out and then Infuse used to carefully obtain  hemostasis within the central laminectomy area within areas over both  nerve roots at L4.  Medium Hemovac drain placed in depth of the incision  exiting over the right lower lumbar spine.  Lumbodorsal fascia then  approximated at the spinous processes using interrupted #1 Vicryl  sutures.  The subcu layers approximated with interrupted #1 and 0 Vicryl  sutures.  More subcu layers with interrupted 2-0 Vicryl sutures and the  skin closed with stainless steel staples.  VAC was then applied to the  skin incision  site posteriorly.  VAC charged to 100 mL of vacuum.  The  patient's deep drain to separate reservoir.  Intraoperative neuro  monitoring  demonstrated no changes throughout the patient's case.  The soft tissue  resistance was noted.  The patient was then returned to her bed,  reactivated and extubated, returned to the recovery room in satisfactory  condition.  All instrument and sponge counts were correct.      [name], M.D.  Electronically Signed     JEN/MEDQ  D:  04/29/2008  T:  04/30/2008  Job:  [id number]

## 2010-06-22 NOTE — Consult Note (Signed)
[name], DYMEK NO.:  [id number]   MEDICAL RECORD NO.:  [id number]          PATIENT TYPE:  INP   LOCATION:  5154                         FACILITY:  [facility]   PHYSICIAN:  Adolph Pollack, M.D.DATE OF BIRTH:  28-Feb-1950   DATE OF CONSULTATION:  DATE OF DISCHARGE:                                 CONSULTATION   REQUESTING PHYSICIAN:  [name], MD.   REASON:  Possible bowel obstruction.   HISTORY:  This is a 60 year old female who underwent a L4-L5, L5-S1  spine surgery by combined anterior retroperitoneal approach and  posterior approach on April 29, 2008.  This is complicated by urinary  tract infection and postoperative ileus.  She was able to be discharged  on March 31 tolerating a diet and moving her bowels.  Approximately  April 2, however, she began having some crampy lower abdominal pain and  nausea and vomiting, which persisted and April 7.  She was admitted to  [facility] and felt to have a partial small-bowel  obstruction.  An NG tube was placed on April 8 with significant bilious  output and she subsequently is transferred the service of Dr. Vira Browns today who asked [place] to see her.  Last bowel movement was about 2  days ago.  This is her first abdominal surgery.   PAST MEDICAL HISTORY:  1. Obesity.  2. Hypertension.  3. Hyperglycemia.  4. Urinary tract infection.  5. Gastroesophageal reflux disease.  6. Lumbar spine degenerative joint disease.  7. Chronic pain syndrome.   PREVIOUS OPERATIONS:  As above plus previous lumbar spine surgery.   MEDICATIONS:  Gabapentin, morphine, Naprosyn, and Nexium.   ALLERGIES:  No known drug allergies.   SOCIAL HISTORY:  She is disabled from the back pain and surgeries.  No  tobacco use.  Rare alcohol use.   REVIEW OF SYSTEMS:  CARDIOVASCULAR:  She denies any heart disease.  GI:  She denies peptic ulcer disease, hepatitis.  She states she has not had  anything to eat of  significance for about a week.   PHYSICAL EXAMINATION:  GENERAL:  An obese female.  She is in no acute  distress, pleasant and cooperative.  VITAL SIGNS:  Her temperature is 100 degrees, blood pressure is 156/83,  pulse of 94.  HEENT:  Normal for nasogastric tube draining bilious material.  RESPIRATORY:  Breath sounds equal and clear.  Respirations unlabored.  CARDIOVASCULAR:  Regular rate and rhythm.  ABDOMEN:  Soft and obese, but there is no significant tenderness.  No  guarding.  Intermittent bowel sounds are heard.  There is a left  paramedian incision that is clean and intact with intermittent staples.  There are no obvious hernias.  GU:  No obvious palpable inguinal hernias.   LABORATORY DATA:  Demonstrates a hemoglobin 12, white cell count of  12,600.  Sodium 134, potassium 3.1, glucose 168.  Amylase and lipase  normal.  Urinalysis pending.   Abdominal x-ray demonstrates some dilated small-bowel loops with air-  fluid levels, but no free air.   IMPRESSION:  Partial small-bowel obstruction versus ileus.  Also,  has  some hypokalemia.   PLAN:  Continue NG tube decompression.  We will need to start some  parenteral nutrition or correct the potassium.  We will order CT scan of  abdomen and pelvis with oral and IV contrast to evaluate for possible  point of obstruction versus ileus.      Adolph Pollack, M.D.  Electronically Signed     TJR/MEDQ  D:  05/17/2008  T:  05/18/2008  Job:  [id number]   cc:   [name], M.D.

## 2010-06-22 NOTE — Consult Note (Signed)
VASCULAR SURGERY CONSULTATION   [name] [name] H  DOB:  July 18, 1950                                       03/06/2008  ZOXWR#:[id number]   REFERRING PHYSICIAN:  [name], M.D.   REFERRAL DIAGNOSIS:  Lumbar degenerative disk disease.   HISTORY:  The patient is a 60 year old obese African American female  scheduled to undergo L4-5 anterior lumbar interbody fusion.  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  History of chronic back pain.   PAST MEDICAL HISTORY:  1. Hypertension.  2. Fibromyalgia.   MEDICATIONS:  1. Micardis 80/12 one tablet daily.  2. Tramadol 50 mg 6 tablets daily.  3. Pristiq 50 mg daily.  4. Neurontin 300 mg t.i.d.  5. Naproxen 500 mg b.i.d.  6. Morphine 30 mg up to 9 tablets daily.  7. Nexium 20 mg daily.   ALLERGIES:  None known.   SOCIAL HISTORY:  The patient is married with one child.  She is on long-  term disability.  No alcohol or tobacco use.   FAMILY HISTORY:  Mother is living, age 51 with dementia, hypertension,  diabetes and a history of stroke.  Father living, age 70, has a  pacemaker.  One brother living, age 29 with a history of diabetes.   REVIEW OF SYSTEMS:  Refer to patient encounter form.  The patient notes  chronic back pain, arthritis and leg pain.   PHYSICAL EXAMINATION:  General:  An obese 60 year old African American  female.  No distress.  Alert and oriented.  Vital signs:  BP 112/68,  pulse 92, temperature 398.  Neck:  Supple.  No thyromegaly or  adenopathy.  Chest:  Distant breath sounds.  No rales or rhonchi.  Cardiovascular:  No carotid bruits.  Normal heart sounds without  murmurs.  Regular rate and rhythm.  Abdomen:  Obese.  Soft, nontender.  No masses or organomegaly.  No scars.  Lower extremities:  Intact  femoral popliteal, posterior tibial, dorsalis pedis pulses bilaterally.   IMPRESSION:  1. Chronic back pain associated with degenerative lumbar disk  disease.  2. Morbid obesity.  3. Hypertension.  4. Fibromyalgia.   RECOMMENDATIONS:  The patient will pose a technical challenge, however,  no contraindication to L4-5 ALIF.  Details of the operative procedure  were reviewed with the patient including potential complications.  These  include but are not limited to bleeding, transfusion, infection, DVT,  pulmonary embolus, ureter injury, hernia or other major complication.   [name] [name], M.D.  Electronically Signed  PGH/MEDQ  D:  03/06/2008  T:  03/07/2008  Job:  [id number]

## 2010-06-22 NOTE — Discharge Summary (Signed)
[name], KOTOWSKI NO.:  [id number]   MEDICAL RECORD NO.:  [id number]          PATIENT TYPE:  INP   LOCATION:  5011                         FACILITY:  [facility]   PHYSICIAN:  [name], M.D.   DATE OF BIRTH:  Dec 31, 1950   DATE OF ADMISSION:  04/29/2008  DATE OF DISCHARGE:  05/07/2008                               DISCHARGE SUMMARY   ADDENDUM:   PROCEDURES:  On April 29, 2008, the patient underwent anterior lumbar  interbody fusion at the L4-5 level after removal of PEEK cage anteriorly  and takedown of nonunion at the L4-5 level.  A combination of iliac  crest bone graft harvested through a separate incision and Infuse, as  well as Vitoss, and bone marrow aspirate were utilized at the anterior  L4-5 level.  Internal fixation of the ALIF using a cancellous screw and  washer into the superior endplate of L5, holding the cage in place.  Tisseel used at the anterior aspect of the interbody fusion site.  VAC  application to the anterior lumbar incision site as well as anterior  iliac crest bone graft harvest site.  The patient was then placed in the  prone position and underwent the second portion of the procedure, which  was revision of posterolateral fusion at L4-5 with revision of hardware  at L4-5 level and extension of posterior fusion and hardware to L3-4 and  the L5-S1 level.  Posterolateral fusion at L3 through S1 with  combination of left iliac crest bone graft, harvested through separate  incision anteriorly.  Vitoss used with bone marrow aspiration from the  left S1 pedicle and vertebral body.  Infuse utilized for the  posterolateral fusion as well.  Instrumentation L3 through S1 using  Monarch pedicle screws and rods, redo central laminectomy at L4-5 and at  L3-4.  VAC applied to the posterior lumbar incision.   PROCEDURES PERFORMED:  1. [name], MD  2. [name], MD, assisted by [name], [facility], under general      anesthesia.   CONSULTATIONS:  [name], MD for exposure anteriorly.   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  Following the decompression, she developed spondylolisthesis  and required surgical intervention for redo decompression and  arthrodesis using a PEEK spine concept for TLIF and posterolateral  fusion using pedicle screws and rods at the L4-5 level.  She initially  did well but gradually had loosening of the implants and eventual  extrusion of the PEEK cage anteriorly.  She has been treated  conservatively for 2 years.  She required significant care after the  second procedure for wound healing as she had wound dehiscence and  required 8 months of serial dressing changes and packing to heal her  posterior lumbar surgery site by secondary intention.  Once the incision  was healed and she was able to progress to physical therapy, she had  significant pain in both lower extremities with difficulty standing and  walking.  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  Studies have also shown  severe disk degeneration at the L3-4 level with inflammatory tissue  around the L4-5 level and degenerative disk disease at the L5-S1 level.  Diskography has shown replication of pain at the L2-3, L3-4, and L5-S1  levels.  She requires pain control via a pain management physician in  her hometown in [place].  She has chronic use of narcotic medications  now.  She has not been able to progress with her activity level, and her  activities of daily living are hard to perform secondary to her  discomfort.  It was felt that she would benefit from surgical  intervention and was admitted for the procedure as stated above.  Preoperatively, she was seen by her primary care physician and was given  medical clearance.  She donated 2 units of blood  and had her Aspen LSO  refurbished.  The patient was seen preoperatively by Dr. [name] as well  for evaluation and was felt to be a suitable candidate for an anterior  approach.   BRIEF HOSPITAL COURSE:  The patient tolerated the procedure under  general anesthesia without complications.  Postoperatively, she was  placed in the step-down unit.  She was stable over the next 24 hours and  was eventually sent to the orthopedic floor.  Pain was initially  controlled with IV analgesics, and she was gradually weaned to p.o.  analgesics with adequate pain control as well.  She was placed on ileus  precautions with clear liquids until bowel sounds returned.  She  eventually was able to have bowel movement.  She was placed on a low-  carbohydrate diet as she was noted to have elevated blood sugars.  Her  hemoglobin A1c was noted to be 6.0.  She was placed on sliding scale  insulin with regular checks for her blood sugar.  Dietary consult for  diabetic nutrition was obtained as well during the hospital stay.  Cultures taken intraoperatively from the anterior fusion site eventually  showed no growth.  The patient remained on Ancef until the cultures were  finalized.  The patient complained of urinary symptoms of burning and  pain with urination.  She was treated with      Pyridium.  Urinalysis  was obtained.  She grew yeast on her urine culture and was placed on  Cipro as well as Diflucan.  The patient developed nausea and vomiting  and abdominal pain.  Her ileus was noted to be resolved.  CT of the  abdomen showed no acute process.  She was felt to be having symptoms  from her urinary tract infection, which once again was treated with  Cipro.  Her nausea and vomiting were relieved gradually with use of  Nexium and Reglan.  A rehab consult was obtained; however, the patient  was not felt to be a suitable candidate for inpatient rehabilitation.  She was able to ambulate as much as 200 feet prior to  discharge to home.  She was able to demonstrate donning and doffing of the Aspen LSO without  difficulty.  Occupational therapy assisted her with ADLs.  Neurovascular  motor function of the lower extremities remained intact throughout the  hospital stay.  The patient had episodes of low-grade fever during the  hospital stay, which resolved.  Hemoglobin and hematocrit dropped to the  lowest value of 7.5 and 22.0 postoperatively.  The patient received a  total of 4 units  of packed red blood cells during the hospital stay.  She also received 300 mL of cell saver blood intraoperatively.   OTHER PERTINENT LABORATORY VALUES AT DISCHARGE:  WBC 10.9, hemoglobin  9.9, hematocrit 28.9.  Coagulation studies on admission were within  normal limits.  Chemistry studies showed episodes of hyponatremia, which  resolved, also episodes of hypokalemia, which resolved, and values were  normal at discharge.  Blood glucose noted to be highest value at 221,  CBGs were checked regularly, and blood sugar was stabilized with sliding  scale insulin.  Calcium at lowest value of 7.6.  Total protein noted to  be decreased at 4.0 with repeat value of 5.3.  Albumin at 2.0 and  amylase 137.  Hemoglobin A1c 6.0.  Final cultures from urine showing  5000 colonies of yeast.  Final tissue cultures from the anterior lumbar  spine showing no growth x3 days and no anaerobes isolated.   CT of the abdomen on May 06, 2008, showed tiny bilateral pleural  effusions and mild bibasilar atelectasis, otherwise, postoperative  changes at L3 through S1 fusion.  Lumbar spine films on May 02, 2008,  showed alignment and position anatomic in the AP and lateral projections  with fusion L3 through S1.  EKG on April 23, 2008, showed normal sinus  rhythm.   PLAN:  The patient was discharged to her home.  Arrangements will be  made for home health physical therapy.  She will continue to ambulate as  tolerated and wear her Aspen LSO at all  times when out of bed.  Prior to  discharge, her VACs were removed from her wounds, and the wounds were  treated with a dry dressing, all of which were healing well without  drainage.  She did have small areas of excoriation around the wounds  from the [facility]; however, these were healing as well.  The patient was  advised to change these dressings on a daily basis.  She may shower as  long as there is no drainage from the wounds.  The patient was advised  to increase her walking weekly.  She will avoid bending, lifting, or  twisting.  The patient is advised to call for an appointment to follow  up with Dr. Otelia Sergeant in 1 week.   MEDICATIONS AT DISCHARGE:  1. MiraLax 1 packet daily with 8 ounces of liquid.  2. Cipro 500 mg p.o. t.i.d.  3. Reglan 10 mg 1 p.o. q.8 h. p.r.n. nausea.  4. Nexium 40 mg daily.  5. Robaxin 750 mg 1 every 8 hours as needed for spasm.  6. Pyridium 200 mg 1 q.8 h. p.r.n. urinary tract pain.  7. OxyContin 10 mg q.12 h.  8. Percocet 5/325 one every 4-6 hours as needed for pain.  She will continue on her home medications including her Micardis,  tramadol, gabapentin, and Pristiq.  She is advised to stop her naproxen  as well as her morphine sulfate.   The patient was advised to call the office should she have questions or  concerns prior to her return office visit.  She was also instructed to  follow up with her primary care physician in regards to her elevated  blood sugars and the possibility of needing a diabetic evaluation.  All  questions were encouraged and answered prior to discharge.   CONDITION ON DISCHARGE:  Stable.      Wende Neighbors, P.A.      [name], M.D.  Electronically Signed    SMV/MEDQ  D:  05/15/2008  T:  05/16/2008  Job:  [id number]   cc:   [name], M.D.

## 2010-06-22 NOTE — Discharge Summary (Signed)
NAMESHEA, [name] NO.:  [id number]   MEDICAL RECORD NO.:  [id number]          PATIENT TYPE:  INP   LOCATION:  5011                         FACILITY:  [facility]   PHYSICIAN:  [name], M.D.   DATE OF BIRTH:  26-Dec-1950   DATE OF ADMISSION:  04/29/2008  DATE OF DISCHARGE:  05/07/2008                               DISCHARGE SUMMARY   ADMISSION DIAGNOSES:  1. 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  2. Degenerative disk disease L3-4 and L5-S1.  3. Gastroesophageal reflux disease.  4. Hypertension  5. Obesity.   DISCHARGE DIAGNOSES:  1. 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  Degenerative disk disease L3-      4 and L5-S1 with lateral recess stenosis and foraminal entrapment      at the L3-4 and L4-5 levels.  Abundant posterior scar material.      Transforaminal lumbar interbody fusion cage displaced anteriorly      and requiring removal anteriorly with noted erosion over the      superior anterior lip of L5.  2. Gastroesophageal reflux disease.  3. Hypertension.  4. Obesity.  5. Urinary tract infection with culture showing yeast.  6. Hyperglycemia with normal hemoglobin A1c and no history of diabetes      mellitus.  7. Postoperative ileus, resolved.  8. Posthemorrhagic anemia, requiring blood transfusion.  9. Prolonged nausea and vomiting resolved at discharge with mildly      elevated amylase at 137.   PROCEDURE:  On April 29, 2008 the patient underwent anterior lumbar  interbody fusion with removal of peak cage anteriorly and takedown of  nonunion and anterior lumbar interbody fusion.    DICTATION ENDED AT THIS POINT.      [name], P.A.      [name], M.D.  Electronically Signed    SMV/MEDQ  D:  05/15/2008  T:  05/16/2008  Job:  [id number]

## 2010-06-22 NOTE — Op Note (Signed)
NAMERON, JUNCO NO.:  [id number]   MEDICAL RECORD NO.:  [id number]          PATIENT TYPE:  INP   LOCATION:  3312                         FACILITY:  [facility]   PHYSICIAN:  [name], M.D.    DATE OF BIRTH:  1950/10/30   DATE OF PROCEDURE:  04/29/2008  DATE OF DISCHARGE:                               OPERATIVE REPORT   SURGEON:  [name], MD   CO-SURGEON:  [name], MD   ANESTHETIC:  General endotracheal.   PREOPERATIVE DIAGNOSES:  L4-5 and L5-S1 degenerative disk disease.   POSTOPERATIVE DIAGNOSES:  L4-5 and L5-S1 degenerative disk disease.   PROCEDURE:  L4-5 and L5-S1 anterior lumbar interbody fusion.   CLINICAL NOTE:  [name] is an obese 60 year old female with  history of chronic back problems and previous posterior fusion  procedure.  She was seen in consultation preoperatively in the office,  does have morbid obesity increasing her perioperative risk.  She was,  however, felt to be a candidate for anterior exposure for 2-level ALIF  at L4-5 and L5-S1.  Details of the operative procedure were reviewed  with the patient preoperatively.  Potential complications were discussed  including but not limited to transfusion, bleeding, limb ischemia, DVT,  pulmonary embolus, ureter injury, nerve injury, deep infection, hernia,  or other major complication.   OPERATIVE PROCEDURE:  The patient was brought to the operating room in  stable condition.  Placed under general endotracheal anesthesia.  Pulse  oximetry on the left leg.  Foley catheter and arterial line in place.  Placed under general endotracheal anesthesia, in the supine position,  the abdomen prepped and draped in a sterile fashion.   A left paramedian oblique skin incision was made from the pubis to  umbilicus.  Subcutaneous tissue was divided with electrocautery.  Deep  dissection was carried down, the left anterior rectus sheath was  incised.  The rectus muscle mobilized  laterally.  The retroperitoneal  space entered.  The peritoneum pushed off the posterior rectus sheath at  the arcuate line, the posterior rectus sheath incised longitudinally.  The peritoneal contents rotated anteriorly and the left ureter mobilized  with the peritoneal contents.  The left common and external iliac artery  were skeletonized bluntly pushing lymphatics laterally.  The L5-S1 disk  was palpated.  This was quite collapsed and degenerated.  The soft  tissues pushed off the L5-S1 disk.  Middle sacral vessels controlled  with bipolar cautery and divided.  The L5-S1 disk was exposed from left-  to-right with blunt dissection adequately for exposure.   Attention then placed on the L4-5 exposure.  This was approached from  lateral to the vessels.  The lymphatics pushed laterally and the vessels  pushed medially.  The left common and external iliac vein were exposed.  The iliolumbar vein was exposed, ligated with 2-0 silk and clips, and  divided.  The left L5 nerve root was preserved.   The patient had undergone a previous posterior fusion and the prosthesis  had extruded anteriorly to the left.  This was identified in the L4-5  disk space.  The vein was scarred down to the anterior surface of the  vertebral bodies at L4 and L5.  This required sharp dissection along the  bodies of the vertebrae to mobilize the vein completely and  retracted to the patient's right.  With full exposure of the L4-5 disk  space, retractors were then placed with reverse lip blades using the  Thompson retractor system.  At completion of this, Dr. Otelia Sergeant then began  the work at L4-5.   There were no complications during the exposure procedure.      [name], M.D.  Electronically Signed     PGH/MEDQ  D:  04/29/2008  T:  04/30/2008  Job:  [id number]   cc:   [name], M.D.

## 2010-06-25 NOTE — Discharge Summary (Signed)
NAMEADALEEN, HULGAN NO.:  [id number]   MEDICAL RECORD NO.:  [id number]          PATIENT TYPE:  INP   LOCATION:  5030                         FACILITY:  [facility]   PHYSICIAN:  [name], M.D.   DATE OF BIRTH:  12/30/1950   DATE OF ADMISSION:  05/23/2006  DATE OF DISCHARGE:  05/28/2006                               DISCHARGE SUMMARY   ADMISSION DIAGNOSIS:  1. Degenerative spondylolisthesis L4-5 status post central      decompressive laminectomy L4 with removal of spinous process and      central portion of the lamina of L4.  2. Gastroesophageal reflux disease.  3. Hypertension.  4. Arthritis.  5. Morbid obesity   DISCHARGE DIAGNOSIS:  1. Degenerative spondylolisthesis L4-5 status post central      decompressive laminectomy L4 with removal of spinous process and      central portion of the lamina of L4.  2. Gastroesophageal reflux disease.  3. Hypertension.  4. Arthritis.  5. Morbid obesity.  6. Posthemorrhagic anemia  7. Postop ileus resolved at discharge  8. Hyponatremia, resolving at discharge.   PROCEDURE:  On 05/23/2006 the patient underwent redo central laminectomy  L4 with decompression of bilateral L4-L5 nerve roots, left-sided  transforaminal lumbar interbody fusion with local bone graft,  posterolateral fusion L4-5 with VITOSS bone marrow aspirate right L4  pedicle and vertebral body with pedicle screws and rod instrumentation  performed by Dr. Otelia Sergeant, assisted by [name] PA-C under general  anesthesia   CONSULTATIONS:  None.   BRIEF HISTORY:  The patient is a 60 year old female status post central  laminectomy L4-5 level for disk herniation centrally at this segment.  Postoperatively she initially did well but developed progressing  increase in neurogenic claudication.  Radiographs demonstrated  developing spondylolisthesis at the L4-5 level.  An MRI scan was  performed preoperatively demonstrating the developing spondylolisthesis  at L4-5.  It was felt that she would require surgical intervention and  was admitted for the procedure as stated above.   BRIEF HOSPITAL COURSE:  The patient tolerated the procedure under  general anesthesia without complications.  On the first postoperative  day the patient's Hemovac drain was discontinued.  Her wound was without  drainage at that time.  Later on during the hospital stay she did  develop some drainage from her wound as she had required stay sutures  for wound closure secondary to her size.  Marland Kitchen  Her wound did not appear to  be infected as there was no purulent drainage and daily dressing changes  were performed.  The patient was treated with PCA analgesics initially.  She was gradually weaned to p.o. analgesics through the hospital stay  and was utilizing p.o. analgesics in the form of OxyContin and OxyIR at  the time of discharge which was adequate for pain control.  The patient  did have some mildly elevated glucose levels during the hospital stay  which was felt to be related to her IV fluids which did contain  dextrose.  Her hemoglobin A1c was within normal limits.  The patient had  slow return of bowel function.  She did have some bloating and decreased  bowel sounds.  Her diet was held until she was having flatus and bowel  movement and eventually she was able to start a regular diet.  Foley  catheter was discontinued and the patient was able to void without  difficulty.  The patient received occupational therapy for ambulation  and gait training.  She was fitted with an Aspen LSO.  The patient was  allowed to don and doff the brace at bedside but was encouraged to wear  the brace at all times when out of bed.  She did have posthemorrhagic  anemia requiring blood transfusion and received a total of 2 units of  packed red blood cells.  On 05/28/2006 the patient was felt stable for  discharge to her home.  Arrangements were made for home health physical  therapy,  occupational therapy and bath aide.   PERTINENT LABORATORY VALUES:  Admission hemoglobin and hematocrit 12.2  and 35.3.  Hemoglobin dropped to lowest value of 8.2 with hematocrit  23.6.  After transfusion, values returned to 11.0 and 31.9.  Mild  hyponatremia as low as 132 resolving at discharge.  Glucose ranging from  112-238.  Calcium 8.0, hemoglobin A1c 6.4 which was mildly elevated and  the patient was given a low-carbohydrate diet during the hospital stay.  She did not require use of insulin.   CONDITION ON DISCHARGE:  Stable.   PLAN:  The patient was instructed to continue wearing her brace at all  times except when she was at bedrest.  She will utilize a walker for  ambulation.  Physical therapy and occupational therapy will see her at  her home for ambulation and gait training and ADLs.  She will keep her  incision dry and clean at all times until she returns to see Dr. Otelia Sergeant.  Daily dressing changes to be done at home.  The patient will follow up  with Dr. Otelia Sergeant 2 weeks from the date of surgery.   MEDICATIONS AT DISCHARGE:  OxyContin and OxyIR.  She will resume her  home medications as taken prior to admission.  The patient will be on a  low carbohydrate diabetic diet with no concentrated sweets.  She is  encouraged to drink plenty of water.  She will use over-the-counter  stool softeners and laxatives as needed.  She is encouraged to walk as  much as tolerated increasing to up to one mile per day.      Wende Neighbors, P.A.      [name], M.D.  Electronically Signed    SMV/MEDQ  D:  07/25/2006  T:  07/25/2006  Job:  [id number]

## 2010-06-25 NOTE — Discharge Summary (Signed)
Virginia [name], [name] NO.:  [id number]   MEDICAL RECORD NO.:  [id number]          PATIENT TYPE:  INP   LOCATION:  5154                         FACILITY:  [facility]   PHYSICIAN:  [name], M.D.   DATE OF BIRTH:  [birth year]   DATE OF ADMISSION:  05/16/2008  DATE OF DISCHARGE:  05/26/2008                               DISCHARGE SUMMARY   ADMISSION DIAGNOSES:  1. Small bowel obstruction.  2. Status post anterior lumbar interbody fusion and posterolateral      lumbar fusion, L3-L4, L4-L5, and L5-S1 on April 29, 2008 with      postoperative complication of ileus and urinary tract infection,      resolved at discharge.  3. Obesity.  4. Hypertension  5. Gastroesophageal reflux disease.  6. Chronic pain syndrome.   DISCHARGE DIAGNOSES:  1. Small bowel obstruction.  2. Status post anterior lumbar interbody fusion and posterolateral      lumbar fusion, L3-L4, L4-L5, and L5-S1 on April 29, 2008 with      postoperative complication of ileus and urinary tract infection,      resolved at discharge.  3. Obesity.  4. Hypertension  5. Gastroesophageal reflux disease.  6. Chronic pain syndrome.  7. Pulmonary embolism, treated with anticoagulation and then IVC      filter placement.  8. Hypokalemia, resolved.   PROCEDURES:  1. On May 20, 2008, the patient underwent IVC filter performed in      the Interventional Radiology Department  2. On May 22, 2008, the patient underwent diagnostic laparoscopy and      laparoscopic lysis of adhesions performed by Dr. Gerrit Friends, assisted      by Dr. Janee Morn and Junious Silk, NP   CONSULTATIONS:  1. [facility].  2. [facility].  3. Liliane Bade, MD   BRIEF HISTORY:  The patient is a 60 year old female status post anterior  lumbar fusion at L4-L5 with posterolateral fusion, L3 to sacrum on April 29, 2008.  Her hospital course was fairly uneventful, but she did  develop a urinary  tract infection, which was treated with antibiotics  and a postop ileus that was resolved at discharge.  When she was  discharged on May 07, 2008, she was tolerating a regular diet and was  moving her bowels.  Several days after discharge, she began having  severe cramping lower abdominal pain with nausea and vomiting.  On May 14, 2008, she was admitted to [facility] of [place], [place]  and felt to have a partial small bowel obstruction.  She was treated  with NG tube.  As her symptoms felt to possibly require surgical  intervention, she and her family preferred to be transferred to [facility] where her previous surgeries had been performed.  She was  admitted to [facility] on May 16, 2008 to Dr. Barbaraann Faster service.  At that time,  a consult was obtained from the [facility].  It was felt that the patient most likely had a small bowel obstruction  as her CT scan indicated signs of this.  She  was placed on strict bowel  rest.  She was noted to be hypokalemic and her potassium was repleted  with supplementation.  Also, on CT of the abdomen, an incidental  pulmonary embolism bilaterally was noted.  She was then placed on  heparin for pulmonary embolism.  TNA nutrition was started and followed  by the nutritionist during the hospital stay.  The Pharmacy at [facility]  helped to adjust the Coumadin.  The patient was seen by the physical  therapist and increased activity was started with ambulating in the  hallway utilizing her Aspen LSO.  Staples were removed from her anterior  wound as well as her posterior lumbar wound from the previous surgery on  May 21, 2008.  The patient underwent IVC filter placement on May 20, 2008 in anticipation for surgical intervention for the small bowel  obstruction.  The procedure was done on May 22, 2008.  She was noted  to have an internal hernia through the peritoneum.  Following her  procedure, her NG tube was  discontinued and her diet was slowly  advanced.  The patient was able to start having bowel movements.  She  was voiding independently.  The patient's heparin was continued by the  Pharmacy and she was begun on Coumadin therapy.  Her activity did  increase as she began feeling better.  She required very little narcotic  analgesics for her back.  Her abdomen was soft and nontender.  She was  felt medically and orthopedically stable for discharge home on May 26, 2008.  At that time, she was afebrile and vital signs were stable.  Other pertinent laboratory values on admission, WBC was 12.6, which  normalized by the time of discharge to 8.7.  Hemoglobin and hematocrit  at discharge, 8.4 and 24.9.  Chemistry studies with hypokalemia,  resolved.  She also had elevated glucose throughout the hospital stay.  Previous hemoglobin A1c at her previous hospitalization had been normal.  She was treated with low-carbohydrate diet.  Urinalysis on May 17, 2008 showed 30 protein, few epithelials, mucus present.   PLAN:  The patient was discharged to her home.  She did have assistance  by her husband, 24 hours a day.  Arrangements were made for home health  physical therapy through [facility] [place], [place].  She has all necessary durable medical equipment from her previous  admission.  She was encouraged to walk as much as tolerated.  No  lifting, bending, or twisting.  She will be on a low-carbohydrate diet.  She will keep her abdominal wounds covered and allow the Steri-Strips to  exfoliate spontaneously.  Arrangements were made for her Coumadin  management through her primary care physician in [place], Dr. Melvyn Neth.  She will follow up with Dr. Otelia Sergeant in 3 weeks and will follow up with Dr.  Gerrit Friends in 2 weeks.   MEDICATIONS AT DISCHARGE:  The patient was instructed to resume home  medications as taken prior to admission.  She was given prescriptions  for Coumadin 5 mg daily or as  directed and Vicodin 5/325 one to two  every 4 hours as needed for pain.  She was advised to call the office  should she have questions or concerns prior to her return office visit.   CONDITION ON DISCHARGE:  Stable.      Wende Neighbors, P.A.      [name], M.D.  Electronically Signed    SMV/MEDQ  D:  06/13/2008  T:  06/14/2008  Job:  [id number]

## 2010-06-25 NOTE — Op Note (Signed)
[name], [name] NO.:  [id number]   MEDICAL RECORD NO.:  [id number]          PATIENT TYPE:  INP   LOCATION:  2899                         FACILITY:  [facility]   PHYSICIAN:  [name], M.D.   DATE OF BIRTH:  [birth year]   DATE OF PROCEDURE:  03/30/2006  DATE OF DISCHARGE:                               OPERATIVE REPORT   PREOPERATIVE DIAGNOSIS:  Herniated nucleus pulposus, central and right  side, L4-5.   POSTOPERATIVE DIAGNOSES:  Severe central lumbar spinal stenosis,  retrolisthesis of L4 and L5, bilateral and lateral recess stenosis  affecting the L5 nerve roots.   PROCEDURE:  Central laminectomy, L4-5, with decompression of bilateral  L4 and bilateral L5 nerve roots; microscope used during the procedure.   SURGEON:  [name], M.D.   ASSISTANT:  Wende Neighbors, P.A.-C.   ANESTHESIA:  General via orotracheal intubation,  Dr. Jean Rosenthal.   SPECIMENS:  None.   ESTIMATED BLOOD LOSS:  150 mL.   COMPLICATIONS:  None.   FINDINGS:  The patient had a retrolisthesis of L4 and L5.  No findings  of disk herniation in the area of the disk.  It was noted that with  attempts at trying to mobilize the thecal sac, particularly on the left  side and right side, we were unable to do so due to tethering of the  nerve roots due to spinal stenosis and foraminal entrapment.  Therefore,  central laminectomy was performed and bilateral L4 and L5 nerve roots  decompressed.   DESCRIPTION OF PROCEDURE:  After adequate general anesthesia with the  patient on the [facility] table with the Wilson frame, the arms well padded  at the sides on arm boards, all pressure points well padded, the patient  had bilateral TED stockings placed.  No Foley catheter, as it was a  single level.  She underwent a standard prep with DuraPrep solution and  was draped in the usual manner from the lower dorsal spine to the  midsacral segment.  The patient had spinal needles placed at the  expected L4-5 level, and the upper needle noted to be at L5.  The  incision was taken from this spot cranially, approximately 2 1/2 to 3  inches in length, through the skin and subcutaneous layers, directly  down to the lumbodorsal fascia.  This was incised in the midline and  carried over both sides of the expected spinous processes of L5 and L4,  and up to L3.  A clamp was then placed on the spinous process of L4.  Intraoperative lateral radiograph demonstrated the clamp on the L4  spinous process.  This was marked with cautery with a single transverse  cautery mark.  Cobbs were then used to elevate the paralumbar muscles  off the posterior aspect of the lamina of L4 bilaterally and over the  posterior aspect of the interlaminar space at L4-5, and over the  superior aspect of the lamina of L5 bilaterally.  Bleeders were  controlled using electrocautery.  The Versatec system was necessary in  order to retract this patient's adipose and soft tissue.  The 85-mm  length retractors were necessary in order to expose down to the  posterior aspect of the interlaminar space at L4-5.  These were inserted  and then exposure obtained.  Loupe magnification and a headlamp were  used for initial exposure, first on the right side removing  approximately 40 to 50% of the inferior aspect of the lamina on the  right side at the L4 level up to the insertion of the ligamentum flavum,  then, making an incision over the medial aspect of the ligamentum flavum  and debriding, using a 3 and 4-mm long Kerrison.  Note that the  intraoperative microscope was then carefully draped sterilely and  brought into the field.  It was under the operating microscope that the  incision was made longitudinally into the posterior aspect of the  ligamentum flavum with penetration through the anterior fibers of the  ligamentum flavum into the spinal canal with a Penfield #4, then  elevating and a 3-mm Kerrison was used to debride  the ligamentum flavum  from the intralaminar region off the superior attachment to the L5  lamina, off the medial aspect of the facet at the L4-5 level and the  inferior aspect of the L4 lamina.  Medial osteophytes off the spinous  process were resected as well.  The right side was then decompressed,  foraminotomy performed over the right L5 nerve root, and decompressing  the lateral recess out to the patient's right-sided pedicle at L5.  A  hockey stick neural probe could be passed out the L5 neural foramina.  Attention was then turned to the left side, where, similarly, then,  semihemilaminectomy was performed by resection of a small portion of the  inferior aspect of the lamina of L4 on the left side using a Leksell  rongeur, a high-speed bur, and 2, 3 and 4-mm Kerrisons.  Ligamentum  flavum was then resected off the superior aspect of the lamina of L5 and  a 15-blade scalpel used to incise the ligamentum flavum partially in the  midline, and a Penfield #4 then used to penetrate the anterior fibers of  the ligamentum flavum into the spinal canal using a 3-mm Kerrison then  to resect the ligamentum flavum off the inferior aspect of L4 and the  medial aspect of the L4-5 facet, and the remaining off the L5 lamina  superiorly.  A foraminotomy was performed over the L5 nerve root along  the left side.  The foramen was found to be quite tight and pressing  upon the L4 nerve root due to ligamentum flavum hypertrophy.  Foraminectomy was performed on the L4 nerve root.  Inspection of the  disk from the right side was unable to be performed.  The thecal sac, 5  nerve root and 4 nerve root could not be mobilized to allow for exposure  of the patient's posterior aspect of the vertebral body of L4 due to the  tethering effect of the retrolisthetic segment here, as well as  tethering of the nerve roots within the neural foramina at L4 and L5. On attempts at mobilizing on the left side, we were able  to place a  Penfield #4 into the disk space, but were unable to mobilize the thecal  sac enough to be able to visualize the disk itself, and the 4 nerve root  on the left side was noted to be taking off quite low over the pedicle,  preventing mobilization of the thecal sac.  Using a hockey stick neural  probe, then,  carefully probing behind the disk space and over the  posterior aspect of the patient's dura, it was noted there was no  significant midline structure present compressing the thecal sac.  It  was felt that the retrolisthetic segment was the primary source of the  posterior protrusion noted likely on the patient's MRI, causing severe  stenosis of this segment.  It was determined, then, that the patient  likely was suffering primarily from spinal stenosis.  Using the  operating room microscope, then, the bilateral hemilaminectomies were  then converted to a central laminectomy, resecting the spinous process  of L4 in the midline, the superior third of the L5 spinous process and  the inferior half of the L3 spinous process.  This was carried down to  the central portions of the lamina centrally.  A large 4 and 5-mm  Kerrison were then used to resect central portions of the laminae up to  the L3-4 interval, and then this was then widened bilaterally, resecting  the ligamentum flavum and lamina back to the lateral recess at L4  bilaterally.  Hockey stick neural probes were then passed out the neural  foramina at L4 and used to protect the nerve root, while ligamentum  flavum reflected over the medial and superior aspects of the facet on  the right and left side was resected using 3 and 4-mm Kerrisons until  the L4 foramina could be easily probed using a hockey stick neural  probe.  When this was completed, irrigation was performed.  Gelfoam,  thrombin soaked, was placed in the midline.  Small bleeders were  controlled using bipolar electrocautery. There was no active bleeding   present in the depths of the incision.  Irrigation was performed.  Note  that a hockey stick neural probe was placed at this level and  intraoperative lateral radiograph was obtained, demonstrating the probe  at the L4-5 level, the intended level, as we had truly expected, a  protruded disk, and found primarily spinal stenosis.  This completed the  surgical procedure.  Irrigation again was performed.  The lumbodorsal  fascia was approximated in the midline with interrupted 0 Vicryl sutures  using a UR6 needle.  The subcu layers were approximated with interrupted  0 Vicryl sutures.  The more superficial midlevel area of fat was  approximated with interrupted 0 Vicryl sutures, the more superficial  with interrupted 0 and 2-0 Vicryl sutures, and the skin was closed with  a running subcu stitch of 4-0 Vicryl.  Tincture of  benzoin and Steri-Strips were applied, and 4 x 4's were fixed to the skin with Hyperfix tape.  The patient was then reactivated following  return to a supine position, extubated and returned to the recovery room  in satisfactory condition.  All instrument and sponge counts were  correct.      [name], M.D.  Electronically Signed     JEN/MEDQ  D:  03/30/2006  T:  03/31/2006  Job:  [id number]

## 2010-06-25 NOTE — Op Note (Signed)
NAME:  [name], [name]            ACCOUNT NO.:  [id number]   MEDICAL RECORD NO.:  [id number]          PATIENT TYPE:  INP   LOCATION:  5030                         FACILITY:  [facility]   PHYSICIAN:  [name], M.D.   DATE OF BIRTH:  19-Mar-1950   DATE OF PROCEDURE:  05/23/2006  DATE OF DISCHARGE:                               OPERATIVE REPORT   PREOPERATIVE DIAGNOSIS:  Degenerative spondylolisthesis L4-L5 status  post central decompressive laminectomy L4 with removal of spinous  process and central portion of the lamina of L4.   POSTOPERATIVE DIAGNOSIS:  Degenerative spondylolisthesis L4-L5 status  post central decompressive laminectomy L4 with removal of spinous  process and central portion of the lamina of L4.   PROCEDURE:  Redo central laminectomy L4 with decompression of bilateral  L4 and L5 nerve roots, left sided transforaminal lumbar interbody fusion  using [employer] PEEK U-shaped cage with local bone graft, posterior  lateral fusion L4-L5 with VITOSS bone marrow aspirate right L4 pedicle  and vertebral body.  [employer] pedicle screw and rod  instrumentation.   SURGEON:  [name], M.D.   ASSISTANT:  Wende Neighbors, P.A.-C.   ANESTHESIA:  General via oral tracheal intubation, Dr. Judie Petit.   ESTIMATED BLOOD LOSS:  250-300 mL, Cell Saver return 0.   COMPLICATIONS:  None.   DRAINS:  Hemovac x1 left lower lumbar, Foley to straight drain.   BRIEF CLINICAL HISTORY:  This patient is a 60 year old female who has  undergone central laminectomy at the L4-L5 level for a disc herniation  centrally at this segment but unable to be excised posteriorly.  Patient  with sagittally oriented facets. Large patient, 320-340 pounds.  Postoperatively did well then developed progressive increasing  neurogenic claudication. Postoperative radiographs demonstrating  developing spondylolisthesis at the L4-L5 level, likely a combination of  degenerative disc disease,  degenerative spondylolisthesis, as well as  iatrogenic secondary to central laminectomy.  The patient underwent MRI  scan preoperatively following evaluation.  She is brought to the  operating room to undergo a TLIF and posterior lateral fusion with  posterior instrumentation.   INTRAOPERATIVE FINDINGS:  Bilateral L4 nerve root entrapment within the  neural foramen at the L4-L5 level.  No sign of central herniated disc at  the L4-L5 level.  Severely degenerated disc L4-L5 with barely any  residual nucleus pulposus.   DESCRIPTION OF PROCEDURE:  After adequate general anesthesia, the  patient had a Foley catheter placed.  Standard preoperative antibiotics.  She was placed onto the [facility] rotation spine table and initially  placed in a supine position.  The lower table was then placed over the  patient then she was rolled to a prone position.  She was then were well  padded on all areas and all pressure points.  She had standard prep with  DuraPrep solution from the mid dorsal spine to the sacrum.  Draped in  the usual manner.  Iodine Vidrape was used after first marking out the  old incision scar.   An incision was made following infiltration of the skin and subcu layers  with Marcaine 0.5% with  1:200,000 epinephrine, 20 mL.  The incision in  the midline in the same old incision scar tissue from about the L1 to S1  and S2.  The skin and subcu layers down to the lumbodorsal fascia.  The  lumbodorsal fascia incised over the residual spinous process of L3 and  L5 inferiorly and carried along both sides of the spinous process and  interspinous ligament at L2 and L1 level superiorly and to the L5-S1  level inferiorly.  Cobb was used to elevate the paralumbar muscles at  L2, L3, L5, and S1.  The incision then carried in the midline to the  lowest portion of the lamina that was residual at the L3 and the L5  level and then across at this level laterally exposing the lateral  portions of the  previous laminotomy defect at the L4-L5 level.  This was  then carried over the facet at the L3-L4 level then exposing the  transverse process at L4 vertebra at this level bilaterally and then  removing the facet capsule of the L4-L5 level. Then, carrying the  incision out lateral exposing the transverse process of L5.  Viper  retractors were inserted.   A curet then used to carefully debride scar tissue from the edge of the  previous laminotomy defect, the superior aspect of the L5 lamina, the  medial aspect of the facet at the L4-L5 level, the inferior aspect of  the lamina of L3.  Electrocautery was used debride small amounts of  muscle and tissue to allow for formation of pockets with the posterior  lateral region of L4-L5 both sides.  These areas were then packed during  the central repeat decompression.  On the left side, the inferior  articular process of L4 was resected using a 1/2-inch osteotome and the  3 mm Kerrisons.  Supra-articular process of L5 was similarly resected  and the neural foramen of the left L4 nerve root then well decompressed,  first performing resection of the medial aspect of the superior portion  of the infra-articular process of L4 on the left side over the area near  the entry point to the lateral recess, first determining the area of the  L4 pedicle superiorly, medially and inferiorly.  Resecting bone over the  L4 nerve root as it exits out the neural foramen.   The reflected portion of the ligamentum flavum at the L4-L5 level was  completely resected decompressing the L4 nerve root quite nicely.  The  L4 nerve root and thecal sac were then mobilized using a Penfield 4 and  the posterior aspect of the disc on the left side at L4-L5 evaluated and  found to be bulging.  A 15 blade scalpel was used to incise the disc.  Pituitaries were then used to remove the disc material from the L4-L5 level using straight and upbiting pituitaries.  A laminar spreader  was  used between the residual portion of the spinous process of L3 and  superior portion L5 in order to obtain distraction of the disc space at  L4-L5.  The disc space was further debrided and dilated with 9, 10, and  11 mm dilators.  The disc space was then carefully debrided of any  endplate cartilage material remaining using a scraper appropriately.  Then, using the to the right and left curets curetting the end plates  removing any residual debris.   Following irrigation then, bone graft that was harvested from the  spinous process of L3 and superior aspect  of L5 as well as the facet on  the left side at L4-L5 was then impacted in the intervertebral disc  space through the left sided discectomy opening. Additional bone graft  was then placed into a 11 mm curved PEEK cage.  This horseshoe like cage  was then inserted into the left side using a laminar spreader to  distract the disc space and impact it into place.  Obtaining depth and  packed to a transverse position and kicking over using the curved kicker  impactor.  Observed on C-arm fluoro to be in excellent position and  alignment lateral view.   Attention then turned to placement of the pedicle screws. First on the  left side of L4, an awl was used to make an entry point at the  intersection of the transverse process supra-articular process of L4  over the lateral aspect of the pedicle and a handheld pedicle finder  used to probe the pedicle.  This was probed to 35 mm, 35 mm x 6.5 screw  was chosen, tapped with a 5.5 tap and a 6.5 screw placed on the left  side at L4.  Similarly, this was done on the left side at the L5, using  the awl for entry point, being able to see the medial aspect of the  pedicle, inferior aspect of pedicle being able to be palpated.  This was  directed appropriately.  Handheld pedicle finder then used to probe the  pedicle checking with the ball tipped probe.  There was no sign of  broaching the cortex  of the pedicle of L5, this was measured for depth  and a 40 mm screw chosen x 6.5.  Tapped with 5.5.  Decortication carried  out at the transverse process of L4 and L5 and a 40 mm by 6.5 screw  placed.   Similarly, on the right side at L4 then, an awl used to make an entry  point at the lateral pedicle at L4, observed on C-arm fluoro to be in  good position and alignment and then using a pedicle probe, the pedicle  was probed to a depth of 35 mm, tapped with a 5.5 tap, decortication of  the transverse process carried out, ball tipped probe used to probe the  channel to insure patency of the pedicle with no sign of broach.  The  screw was then placed using 6.5 by 35 mm screws.  Decortication was  carried out of the transverse process prior to placement of the screw.  Final screw then placed on the right side at L5, an awl used to make the  initial entry point into the lateral aspect of the pedicle. Then the pedicle probe then used to probe pedicle to a depth of nearly 40 mm.  6.5 by 40 mm screw was chosen, tapped to 5.5.  Ball tip probe used to  probe the pedicle channel to insure its patency and no sign of broach of  cortex.  40 mm x 6.5 screw then placed on the right side at L5.   C-arm fluoro used to observe the position and alignment of the pedicle  screws in the AP and lateral planes.  The left-sided L4 pedicle screw  appeared to be lateral to the pedicle. Therefore, was replaced, was  removed without difficulty, entry point then obtained slightly  superficial to the initial entry point for the previous hole and  placement of the screw.  A high speed bur was used to help perform a  small corticotomy at the lateral aspect of the pedicle at the L4 level.  Next, the handheld pedicle finder then directed medially convergence and  the screw was then able to be placed and this degree of increased  convergence.  First probing the pedicle, determining the correct  position and alignment  without evidence of broaching of cortex, tapping  with a 5.5 tap and placing a 3.5 by 6.5 screw which obtained excellent  purchase on the left side at 4.  All the pedicle screws were then tested  for soft tissue resistance and measured 23 on the left at L4, 32 on the  left at L5, 22 on the right at L4, and 36 on the right at L5.   Note that bone marrow aspiration was carried out on the right side at  the L4 pedicle by placing a Jamshidi needle within the pedicle into the  vertebral body and aspirating bone marrow, 10 mL.  This was applied to  10 mL of VITOSS calcium oxalate material.  Once this was completed, this  was then cut into sections.  Sections were then placed from the  transverse process of L4 to L5 level.  35 mm rods were then placed  within the fasteners at the L4-L5 level.  These were then tapped into  place.  The L4 caps and fasteners were then attached to the rod and  torqued into position, removing the cap heads as they were torqued.  Compression was then obtained between the L4 and L5 pedicles, first on  the left sided TLIF and then the lower cap tightened and the cap screw  head removed.  Similarly, on the right side, compression obtained  between L4 pedicle and L5-10 screw fastener and the L5 cap was tightened  to the appropriate foot pounds and cap head removed.  This completed  fixation.   Bone graft had been placed correctly.  Irrigation was performed.  Careful inspection demonstrated some areas of persistent tightness over  the right L4 neural foramen such that right sided lateral recess was  decompressed at the L3-L4 level and continued out the neural foramen of  the L4 nerve root obtaining complete decompression of the right L4,  resecting the ligamentum flavum and the right L4-L5 facet.  Foraminotomy  performed on the right L5.  Both of these nerve roots were completely  free at the end of the case.  A hockey stick neural probe then able to be passed out the L4  and L5 nerve root without difficulty.  On the left  side, the neural foramen was completely free for the L4 nerve root and  L5, also was free.  The thecal sac was carefully retracted medially.  This space examined.  The area over the posterior aspect of the  vertebral body of L4 was evaluated as this was the site on preoperative  studies felt to represent an area of disc herniation. With the use of  nerve hook as well as hockey-stick neural probe, no disc herniation was  felt to be present.  It was felt that this likely represented pseudo-  disc herniation on MRI due to the patient's spondylolisthesis.   After further irrigation then, soft tissues were allowed to fall back  into place.  There was no active bleeding present.  A medium Hemovac  drain was placed in the depth of the incision exiting over the left  lower lumbar spine.  The paralumbar muscles loosely approximated in the  midline over the previous  laminotomy defect using #1 Vicryl sutures.  Lumbodorsal fascia reattached to the interspinous ligaments and spinous  processes using figure-of-eight simple sutures of #1, deep subcu layers  approximated with interrupted #1 Vicryl sutures, stay sutures of #2  Prolene using far near/near far sutures and red rubber Robinson ridges  were then applied, three in total.  Stainless steel staples used to  close skin.  Xeroform applied to the skin stay suture interval to try to  decrease any irritation here at the central portion of the incision.  4x4s and ABD pad affixed to the skin with Hypafix tape.  All instrument  and sponge counts were correct.  Note, permanent C-arm images were  obtained in AP and lateral planes documenting position alignment of  pedicle screws and rods.  The patient was then carefully returned to a  supine position, reactivated, extubated, and returned to the recovery  room in satisfactory condition.      [name], M.D.  Electronically Signed     JEN/MEDQ   D:  05/23/2006  T:  05/23/2006  Job:  [id number]

## 2010-06-25 NOTE — Discharge Summary (Signed)
NAMEALLAYAH, RAINERI NO.:  [id number]   MEDICAL RECORD NO.:  [id number]          PATIENT TYPE:  INP   LOCATION:  5024                         FACILITY:  [facility]   PHYSICIAN:  [name], M.D.   DATE OF BIRTH:  Feb 16, 1950   DATE OF ADMISSION:  03/30/2006  DATE OF DISCHARGE:  04/01/2006                               DISCHARGE SUMMARY   ADMISSION DIAGNOSES:  1. Herniated nucleus pulposus central and right L4-5.  2. Hypertension.  3. Gastroesophageal reflux disease.  4. Arthritis.   DISCHARGE DIAGNOSES:  1. Herniated nucleus pulposus central and right L4-5.  2. Hypertension.  3. Gastroesophageal reflux disease.  4. Arthritis.   PROCEDURE:  On March 30, 2006, the patient underwent central  laminectomy L4-5 with decompression of bilateral L4 bilateral L5 nerve  roots utilizing the microscope, performed by Dr. Otelia Sergeant assisted by  [name] under general anesthesia.   CONSULTATIONS:  None.   BRIEF HISTORY:  Patient is a 60 year old black female with several  months history of bilateral leg pain, right greater than left, with  weakness in her legs as well.  She has failed conservative treatment and  MRI has indicated herniated nucleus pulposus L4-5.  It was felt that she  would benefit from decompression and was admitted for the procedure as  stated above.   BRIEF HOSPITAL COURSE:  The patient tolerated the procedure under  general anesthesia without complications.  Postoperatively,  neurovascular function was intact in the lower extremities.  The patient  received physical and occupational therapy on postop day #1.  She was  able to ambulate in the hallway utilizing a walker.  She was afebrile  and vital signs were stable on the second postoperative day.  She was  using oral medications without difficulty.  The patient was having  flatus and was urinating without difficulty.  Dressing changes were done  and wound was found be healing without  erythema or drainage.  She was  felt stable for discharge to her home.   PERTINENT LABORATORY VALUES:  Admission CBC:  WBC 6.6, hemoglobin 13.2,  hematocrit 38.1.  Postoperative H&H 11.9 with hematocrit 34.4, WBC 12.1.  The patient was given intraoperative steroids which was felt to be the  reason for her elevated WBC.  Routine chemistry studies on admission  were within normal limits and repeat on discharge showed glucose 155.  On admission, ALT was elevated at 38, otherwise values normal.  EKG on  admission normal sinus rhythm, no previous EKGs for comparison.   PLAN:  The patient was discharged to home for continuation of her care.  She was instructed in ambulating as tolerated.  She will utilize a  walker for ambulation.  Dressing change will be done daily and she will  be allowed to shower 4-5 days from surgery, if there is no wound  drainage.  She was given prescription for Percocet 5/325 one to two  every 4-6 hours as needed for pain, Robaxin 500 mg one every 8 hours as  needed for spasm.  She will follow up with Dr. Otelia Sergeant 2 weeks from the  date  of surgery.  She will avoid bending, lifting or twisting.  She will  resume her home medications as taken prior to admission.  All questions  encouraged and answered.   CONDITION ON DISCHARGE:  Stable.      Wende Neighbors, P.A.      [name], M.D.  Electronically Signed    SMV/MEDQ  D:  06/15/2006  T:  06/15/2006  Job:  [id number]

## 2011-04-12 IMAGING — CR DG ABDOMEN 2V
4 series · 4 of 4 positions shown · non-contrast
Comparison: 05/18/2008.

CLINICAL DATA: Abdominal distension.  Follow up small bowel
obstruction.

ABDOMEN - 2 VIEW

[w abdomen decub * (1 of 2)]
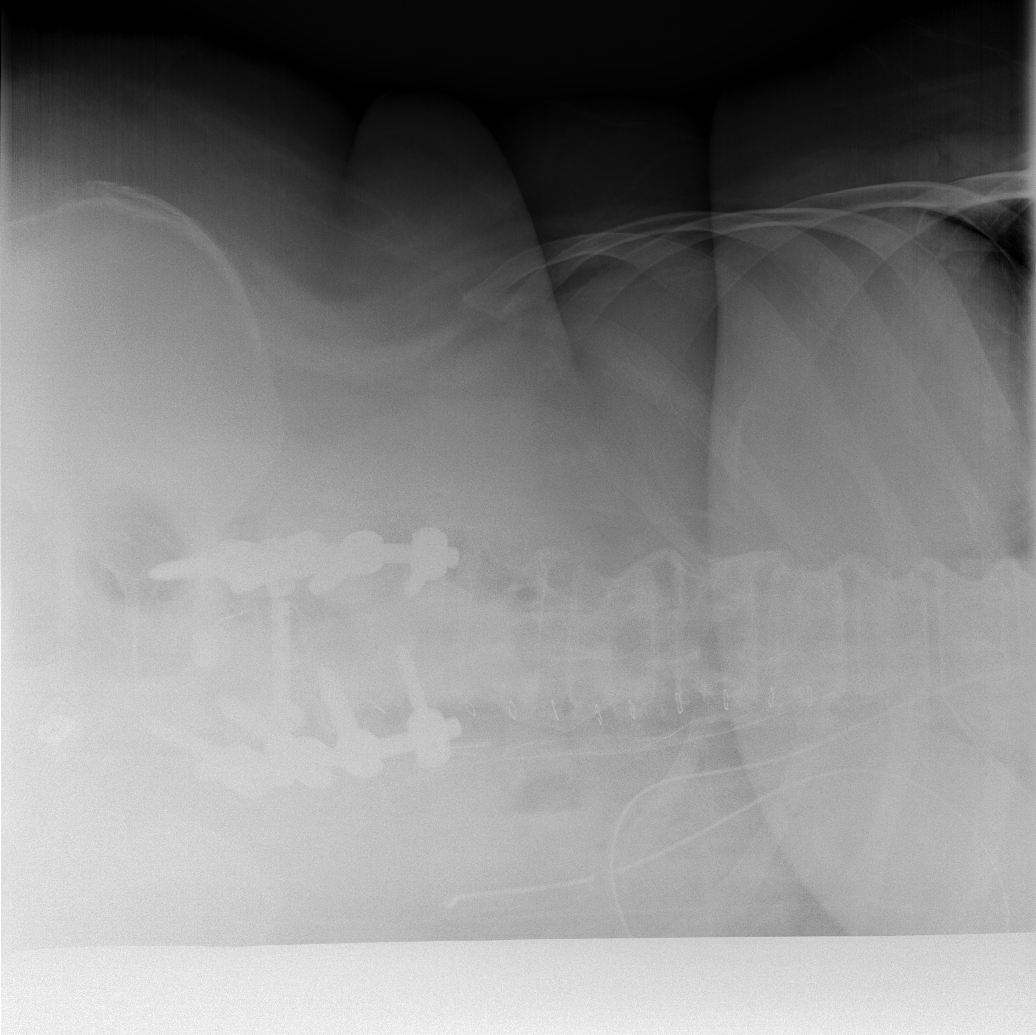

[w abdomen decub * (2 of 2)]
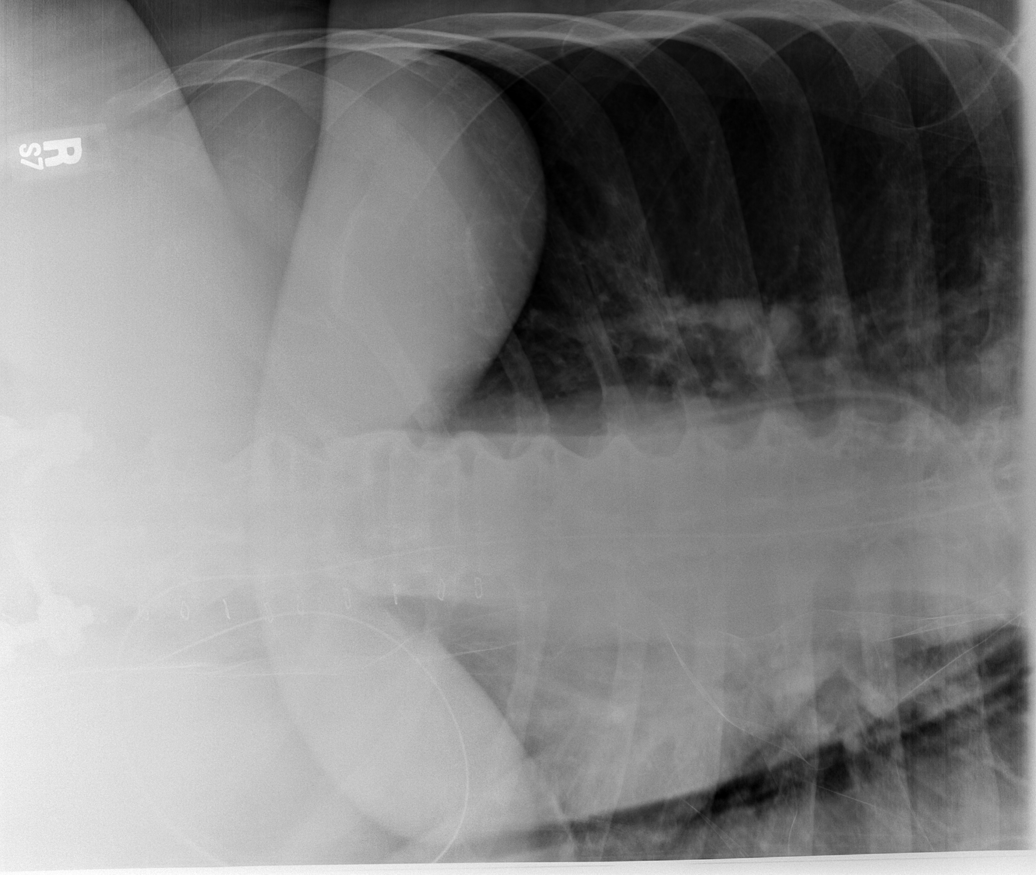

[t abdomen supine * (1 of 2)]
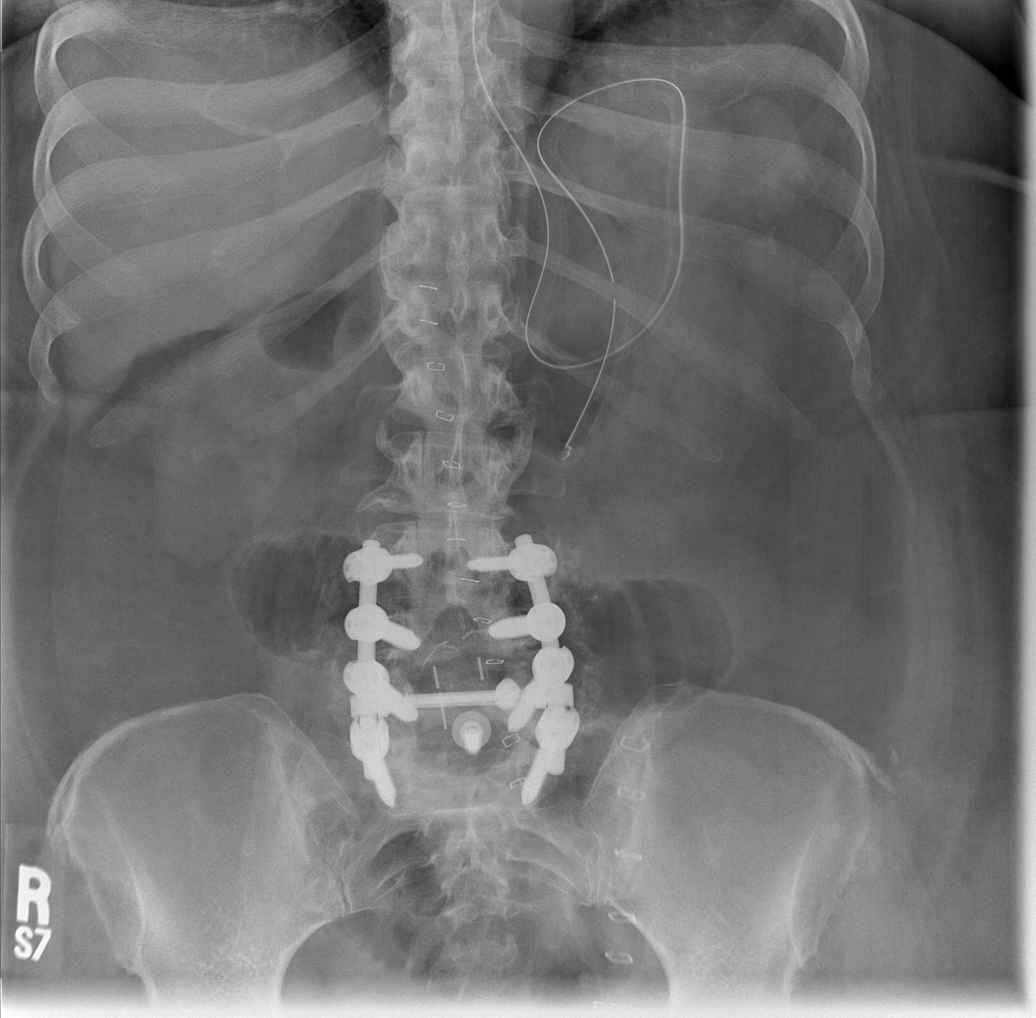

[t abdomen supine * (2 of 2)]
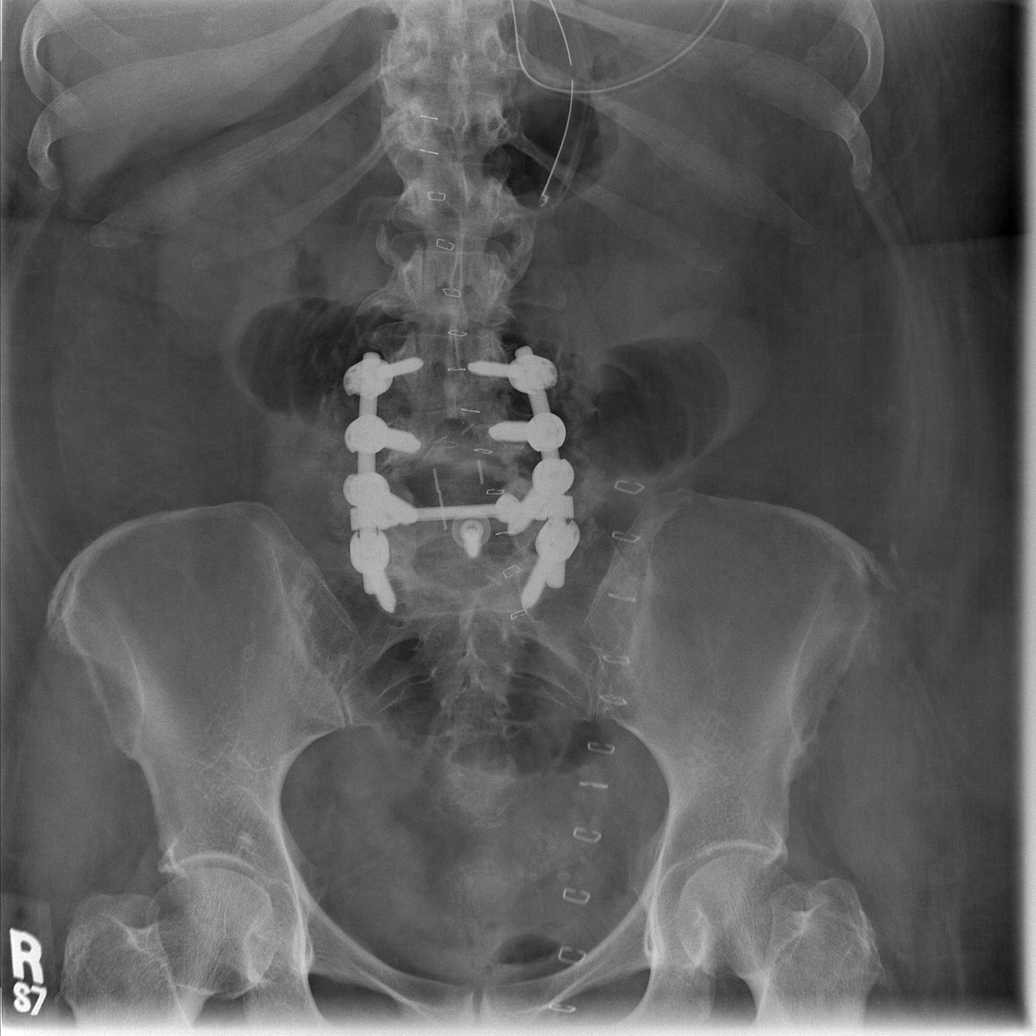

[4 of 4 positions shown; findings below may reference images not displayed]

FINDINGS: Nasogastric tube curled within the stomach with the tip
at the level of the gastric body.  Status post lumbar spine
surgery.  Abnormal gas pattern with gas distended small bowel loops
measuring up to 6.1 cm versus prior 5.3 cm.  Paucity of colonic
bowel gas.  Findings are more suggestive of small bowel obstruction
than postoperative ileus.
IMPRESSION: Progressive small bowel obstructive pattern as discussed above.

## 2013-02-07 DIAGNOSIS — C801 Malignant (primary) neoplasm, unspecified: Secondary | ICD-10-CM

## 2013-02-07 HISTORY — DX: Malignant (primary) neoplasm, unspecified: C80.1

## 2016-01-11 ENCOUNTER — Ambulatory Visit (INDEPENDENT_AMBULATORY_CARE_PROVIDER_SITE_OTHER): Payer: BLUE CROSS/BLUE SHIELD | Admitting: Specialist

## 2016-01-11 ENCOUNTER — Encounter (INDEPENDENT_AMBULATORY_CARE_PROVIDER_SITE_OTHER): Payer: Self-pay | Admitting: Specialist

## 2016-01-11 VITALS — BP 134/79 | HR 83 | Ht 65.0 in | Wt 285.0 lb

## 2016-01-11 DIAGNOSIS — M7582 Other shoulder lesions, left shoulder: Secondary | ICD-10-CM | POA: Diagnosis not present

## 2016-01-11 DIAGNOSIS — M7061 Trochanteric bursitis, right hip: Secondary | ICD-10-CM | POA: Diagnosis not present

## 2016-01-11 DIAGNOSIS — G5603 Carpal tunnel syndrome, bilateral upper limbs: Secondary | ICD-10-CM

## 2016-01-11 DIAGNOSIS — M778 Other enthesopathies, not elsewhere classified: Secondary | ICD-10-CM

## 2016-01-11 MED ORDER — TRAMADOL HCL 50 MG PO TABS: 100.0000 mg | ORAL_TABLET | Freq: Four times a day (QID) | ORAL | 2 refills | Status: DC | PRN

## 2016-01-11 MED ORDER — BUPIVACAINE HCL 0.25 % IJ SOLN
4.0000 mL | INTRAMUSCULAR | Status: AC | PRN
  Administered 2016-01-11: 4 mL via INTRA_ARTICULAR

## 2016-01-11 MED ORDER — METHYLPREDNISOLONE ACETATE 40 MG/ML IJ SUSP
40.0000 mg | INTRAMUSCULAR | Status: AC | PRN
  Administered 2016-01-11: 40 mg via INTRA_ARTICULAR

## 2016-01-11 NOTE — Patient Instructions (Addendum)
Avoid bending, stooping and avoid lifting weights greater than 10 lbs. Avoid prolong standing and walking. Use splints for the wrists at night to decrease carpal tunnel symptoms. Talk to your primary care MD about carpal tunnel and need to repeat EMG/NCV of the hands to assess the severity of the Carpal Tunnel syndrome.  Don't let the hands get constantly numb before considering intervention. When constant numbness is present it can be a sign of more permanent change in the nerve that can be irrreversible even with surgery. Take take Vitamin B complex, and creel or fish oil. Avoid overhead lifting and overhead use of the arms. Ice 20 min on and 60min off to the right hip injection site as needed. Tylenol 500 mg 2 tablet 3 time a day, no more than 6 tablets in 24 hour period. Tramadol for more severe pain. Cortisone shot right hip greater trochanter bursa may cause elevation of your blood glucose temporarily. Talk to your primary care MD about your chronic cough and possible relationship to antihypertension agent Losartan. Consider invokanna for diabetes to assist with weight loss.  Do stretching exercises of the right hip ITB. Look at the exercises for the left shoulder and do range of motion and stretching exercises for the left shoulder.

## 2016-01-11 NOTE — Progress Notes (Signed)
Office Visit Note   Patient: [name]           Date of Birth: 05/18/1950           MRN: [id number] Visit Date: 01/11/2016              Requested by: No referring provider defined for this encounter. PCP: Pcp Not In System   Assessment & Plan: Visit Diagnoses:  1. Greater trochanteric bursitis, right   2. Shoulder tendonitis, left   3. Carpal tunnel syndrome, bilateral     Plan:Avoid bending, stooping and avoid lifting weights greater than 10 lbs. Avoid prolong standing and walking. Use splints for the wrists at night to decrease carpal tunnel symptoms. Talk to your primary care MD about carpal tunnel and need to repeat EMG/NCV of the hands to assess the severity of the Carpal Tunnel syndrome.  Don't let the hands get constantly numb before considering intervention. When constant numbness is present it can be a sign of more permanent change in the nerve that can be irrreversible even with surgery. Take take Vitamin B complex, and creel or fish oil. Avoid overhead lifting and overhead use of the arms. Ice 20 min on and 61min off to the right hip injection site as needed. Tylenol 500 mg 2 tablet 3 time a day, no more than 6 tablets in 24 hour period. Tramadol for more severe pain. Cortisone shot right hip greater trochanter bursa may cause elevation of your blood glucose temporarily. Talk to your primary care MD about your chronic cough and possible relationship to antihypertension agent Losartan. Consider invokanna for diabetes to assist with weight loss.   Do stretching exercises of the right hip ITB. Look at the exercises for the left shoulder and do range of motion and stretching exercises for the left shoulder.   Follow-Up Instructions: Return in about 6 months (around 07/11/2016) for Carpal tunnel.   Orders:  Orders Placed This Encounter  Procedures  . Large Joint Injection/Arthrocentesis   Meds ordered this encounter  Medications  . traMADol (ULTRAM) 50 MG  tablet    Sig: Take 2 tablets (100 mg total) by mouth every 6 (six) hours as needed for moderate pain.    Dispense:  60 tablet    Refill:  2      Procedures: Large Joint Inj Date/Time: 01/11/2016 5:07 PM Performed by: Jessy Oto Authorized by: Jessy Oto   Consent Given by:  Patient Site marked: the procedure site was marked   Timeout: prior to procedure the correct patient, procedure, and site was verified   Indications:  Pain Location:  Hip Site:  R greater trochanter Prep: patient was prepped and draped in usual sterile fashion   Needle Size:  22 G Needle Length:  3.5 inches Approach:  Lateral Ultrasound Guidance: No   Fluoroscopic Guidance: No   Arthrogram: No   Medications:  40 mg methylPREDNISolone acetate 40 MG/ML; 4 mL bupivacaine 0.25 % Aspiration Attempted: No   Patient tolerance:  Patient tolerated the procedure well with no immediate complications  Bandaid applied to the right greater trochanter injection site.     Clinical Data: No additional findings.   Subjective: Chief Complaint  Patient presents with  . Lower Back - Pain    Patient comes in today requesting injection in right hip. States she had one about a year ago with Dr. Ninfa [facility] and they help her.  States her low back still has some pain at times but doing ok  for the most part. Right hip pain laterally with lying on the right side and with standing and walking. No numbness or paresthesias. No bowel or bladder difficulties. Had excellent relief of similar pain November one year ago treated by Dr. Ninfa [facility] with a cortisone injection. Has diabetes, on coumadin chronicly for DVTE. Also with pain left shoulder, that is worse with overhead lifting and overhead use of the left arm. Has numbness and paresthesias both hands at night and with ironning and just lying in bed.     Review of Systems  Constitutional: Negative.   HENT: Negative.   Eyes: Negative.   Respiratory: Positive for cough.     Cardiovascular: Negative.   Gastrointestinal: Negative.   Endocrine: Negative.   Genitourinary: Negative.   Musculoskeletal: Positive for arthralgias and back pain.  Skin: Negative.   Allergic/Immunologic: Negative.   Neurological: Negative.   Hematological: Negative.   Psychiatric/Behavioral: Negative.      Objective: Vital Signs: BP 134/79   Pulse 83   Ht 5\' 5"  (1.651 m)   Wt 285 lb (129.3 kg)   BMI 47.43 kg/m   Physical Exam  Constitutional: She is oriented to person, place, and time. She appears well-developed and well-nourished.  HENT:  Head: Normocephalic and atraumatic.  Eyes: EOM are normal. Pupils are equal, round, and reactive to light.  Neck: Normal range of motion. Neck supple.  Pulmonary/Chest: Effort normal and breath sounds normal.  Abdominal: Soft. Bowel sounds are normal.  Musculoskeletal: Normal range of motion.  Neurological: She is alert and oriented to person, place, and time.  Skin: Skin is warm and dry.  Psychiatric: She has a normal mood and affect. Her behavior is normal. Judgment and thought content normal.    Right Hip Exam   Tenderness  The patient is experiencing tenderness in the greater trochanter and lateral.  Range of Motion  Extension: normal  Flexion: normal  Internal Rotation: normal  External Rotation: normal  Abduction: normal  Adduction: normal   Muscle Strength  Abduction: 5/5  Adduction: 5/5  Flexion: 5/5   Tests  FABER: negative Ober: positive  Other  Erythema: absent Scars: absent Sensation: normal Pulse: present   Back Exam   Tenderness  The patient is experiencing tenderness in the lumbar.  Range of Motion  Lateral Bend Right: normal  Lateral Bend Left: normal  Rotation Right: normal  Rotation Left: normal   Muscle Strength  Right Quadriceps:  5/5  Left Quadriceps:  5/5  Right Hamstrings:  5/5  Left Hamstrings:  5/5   Tests  Straight leg raise right: negative Straight leg raise left:  negative  Reflexes  Patellar: normal Achilles: normal Biceps: normal Babinski's sign: normal   Other  Toe Walk: normal Heel Walk: normal Sensation: normal Gait: normal  Erythema: no back redness Scars: absent   Right Hand Exam   Tenderness  The patient is experiencing tenderness in the palmer area.  Range of Motion   Wrist  Extension: normal  Flexion: normal  Pronation: normal  Supination: normal   Muscle Strength  Wrist Extension: 5/5  Wrist Flexion: 5/5  Grip: 5/5   Tests  Phalen's Sign: positive Tinel's Sign (Medial Nerve): positive  Other  Erythema: absent Scars: absent Sensation: decreased Pulse: present   Left Hand Exam   Tenderness  The patient is experiencing tenderness in the palmer area.   Range of Motion   Wrist  Extension: normal  Flexion: normal  Pronation: normal  Supination: normal   Muscle Strength  Wrist Extension: 5/5  Wrist Flexion: 5/5  Grip:  5/5   Tests  Phalen's Sign: positive Tinel's Sign (Medial Nerve): positive  Other  Erythema: absent Scars: absent Sensation: decreased Pulse: present      Specialty Comments:  No specialty comments available.  Imaging: No results found.   PMFS History: There are no active problems to display for this patient.  No past medical history on file.  No family history on file.  No past surgical history on file. Social History   Occupational History  . Not on file.   Social History Main Topics  . Smoking status: Never Smoker  . Smokeless tobacco: Never Used  . Alcohol use No  . Drug use: No  . Sexual activity: Not on file

## 2016-04-22 ENCOUNTER — Other Ambulatory Visit (INDEPENDENT_AMBULATORY_CARE_PROVIDER_SITE_OTHER): Payer: Self-pay | Admitting: Specialist

## 2016-04-22 ENCOUNTER — Telehealth (INDEPENDENT_AMBULATORY_CARE_PROVIDER_SITE_OTHER): Payer: Self-pay | Admitting: Specialist

## 2016-04-22 DIAGNOSIS — M778 Other enthesopathies, not elsewhere classified: Secondary | ICD-10-CM

## 2016-04-22 DIAGNOSIS — M7061 Trochanteric bursitis, right hip: Secondary | ICD-10-CM

## 2016-04-22 DIAGNOSIS — G5603 Carpal tunnel syndrome, bilateral upper limbs: Secondary | ICD-10-CM

## 2016-04-22 DIAGNOSIS — M7582 Other shoulder lesions, left shoulder: Secondary | ICD-10-CM

## 2016-04-22 MED ORDER — TRAMADOL HCL 50 MG PO TABS: 100.0000 mg | ORAL_TABLET | Freq: Four times a day (QID) | ORAL | 2 refills | Status: DC | PRN

## 2016-04-22 NOTE — Progress Notes (Signed)
I called rx to pharm.   

## 2016-04-22 NOTE — Progress Notes (Signed)
Called rx to pharm  

## 2016-04-22 NOTE — Telephone Encounter (Signed)
I called rx to pharm, pt is aware

## 2016-04-22 NOTE — Telephone Encounter (Signed)
Patient called needing Rx refilled (Tramadol)

## 2016-04-22 NOTE — Telephone Encounter (Signed)
Rx approved, printed and may call to her pharmacy in [place]. jen

## 2016-04-22 NOTE — Telephone Encounter (Signed)
Patient called needing Rx refilled (Tramadol) The number to contact patient is [phone]

## 2016-05-20 ENCOUNTER — Telehealth (INDEPENDENT_AMBULATORY_CARE_PROVIDER_SITE_OTHER): Payer: Self-pay | Admitting: Specialist

## 2016-05-20 NOTE — Telephone Encounter (Signed)
Patient called left voicemail message stating she need a refill on Tramadol.

## 2016-05-20 NOTE — Telephone Encounter (Signed)
Patient called left voicemail message stating she need a refill on Tramadol. The number to contact patient is [phone]

## 2016-05-26 ENCOUNTER — Other Ambulatory Visit (INDEPENDENT_AMBULATORY_CARE_PROVIDER_SITE_OTHER): Payer: Self-pay | Admitting: Specialist

## 2016-05-26 DIAGNOSIS — M7061 Trochanteric bursitis, right hip: Secondary | ICD-10-CM

## 2016-05-26 DIAGNOSIS — M7582 Other shoulder lesions, left shoulder: Secondary | ICD-10-CM

## 2016-05-26 DIAGNOSIS — M778 Other enthesopathies, not elsewhere classified: Secondary | ICD-10-CM

## 2016-05-26 DIAGNOSIS — G5603 Carpal tunnel syndrome, bilateral upper limbs: Secondary | ICD-10-CM

## 2016-05-26 MED ORDER — TRAMADOL HCL 50 MG PO TABS: 100.0000 mg | ORAL_TABLET | Freq: Four times a day (QID) | ORAL | 2 refills | Status: DC | PRN

## 2016-05-26 NOTE — Telephone Encounter (Signed)
Patient called needing a refill on Tramadol. CB # [phone]

## 2016-05-27 ENCOUNTER — Telehealth (INDEPENDENT_AMBULATORY_CARE_PROVIDER_SITE_OTHER): Payer: Self-pay | Admitting: Specialist

## 2016-05-27 NOTE — Progress Notes (Signed)
Called rx to [facility]

## 2016-05-27 NOTE — Telephone Encounter (Signed)
I called and advised pt that I have called her rx to her pharm

## 2016-05-27 NOTE — Telephone Encounter (Signed)
Pt called again about refill on Tramadol she is completely out.

## 2016-06-02 ENCOUNTER — Ambulatory Visit (INDEPENDENT_AMBULATORY_CARE_PROVIDER_SITE_OTHER): Payer: Medicare Other | Admitting: Specialist

## 2016-06-02 ENCOUNTER — Ambulatory Visit (INDEPENDENT_AMBULATORY_CARE_PROVIDER_SITE_OTHER): Payer: Medicare Other

## 2016-06-02 ENCOUNTER — Encounter (INDEPENDENT_AMBULATORY_CARE_PROVIDER_SITE_OTHER): Payer: Self-pay

## 2016-06-02 ENCOUNTER — Encounter (INDEPENDENT_AMBULATORY_CARE_PROVIDER_SITE_OTHER): Payer: Self-pay | Admitting: Specialist

## 2016-06-02 VITALS — BP 114/58 | HR 79 | Ht 64.0 in | Wt 275.0 lb

## 2016-06-02 DIAGNOSIS — M25562 Pain in left knee: Secondary | ICD-10-CM | POA: Diagnosis not present

## 2016-06-02 DIAGNOSIS — M25561 Pain in right knee: Secondary | ICD-10-CM

## 2016-06-02 DIAGNOSIS — M170 Bilateral primary osteoarthritis of knee: Secondary | ICD-10-CM | POA: Diagnosis not present

## 2016-06-02 MED ORDER — METHYLPREDNISOLONE ACETATE 40 MG/ML IJ SUSP
40.0000 mg | INTRAMUSCULAR | Status: AC | PRN
  Administered 2016-06-02: 40 mg via INTRA_ARTICULAR

## 2016-06-02 MED ORDER — BUPIVACAINE HCL 0.25 % IJ SOLN
6.0000 mL | INTRAMUSCULAR | Status: AC | PRN
  Administered 2016-06-02: 6 mL via INTRA_ARTICULAR

## 2016-06-02 MED ORDER — LIDOCAINE HCL 1 % IJ SOLN
3.0000 mL | INTRAMUSCULAR | Status: AC | PRN
  Administered 2016-06-02: 3 mL

## 2016-06-02 NOTE — Progress Notes (Signed)
Office Visit Note   Patient: [name]           Date of Birth: 02-15-1950           MRN: [id number] Visit Date: 06/02/2016              Requested by: No referring provider defined for this encounter. PCP: Pcp Not In System   Assessment & Plan: Visit Diagnoses:  1. Acute pain of both knees   2. Primary osteoarthritis of both knees     Plan: Today after patient consent bilateral knee injections of Marcaine/Depo-Medrol injections were performed. Fingerstick glucose in the clinic this morning was 81. I advised patient that she was strictly monitor her blood sugars and her diet over the next couple of days due to the potential for blood sugars to be elevated from today's injections. She voices understanding and states that she will be compliant with my instructions. We'll follow-up in a couple of months for recheck. We did discuss possible conservative treatment with Visco supplementation injections. She understands that definitive treatment would eventually be total knee replacements but I do not feel that this is indicated at this point. I gave patient a procedure for a rolling walker.  Follow-Up Instructions: Return in about 2 months (around 08/02/2016).   Orders:  Orders Placed This Encounter  Procedures  . XR Knee 1-2 Views Left  . XR Knee 1-2 Views Right   No orders of the defined types were placed in this encounter.     Procedures: Large Joint Inj Date/Time: 06/02/2016 10:19 AM Performed by: Lanae Crumbly Authorized by: Lanae Crumbly   Consent Given by:  Patient Timeout: prior to procedure the correct patient, procedure, and site was verified   Location:  Knee Site:  R knee Needle Size:  25 G Needle Length:  1.5 inches Approach:  Anterolateral Ultrasound Guidance: No   Fluoroscopic Guidance: No   Medications:  3 mL lidocaine 1 %; 40 mg methylPREDNISolone acetate 40 MG/ML; 6 mL bupivacaine 0.25 % Aspiration Attempted: No   Large Joint Inj Date/Time:  06/02/2016 10:19 AM Performed by: Lanae Crumbly Authorized by: Lanae Crumbly   Consent Given by:  Patient Timeout: prior to procedure the correct patient, procedure, and site was verified   Location:  Knee Site:  L knee Needle Size:  25 G Needle Length:  1.5 inches Approach:  Anterolateral Ultrasound Guidance: No   Fluoroscopic Guidance: No   Arthrogram: No   Medications:  3 mL lidocaine 1 %; 40 mg methylPREDNISolone acetate 40 MG/ML Aspiration Attempted: No       Clinical Data: No additional findings.   Subjective: No chief complaint on file.   HPI Patient comes in today with complaints of bilateral knee pain. At times right worse than left. She has known history of DJD and previously had intra-articular Marcaine/Depo-Medrol injections about 10 years ago. She had been doing reasonably well up until a couple months ago when she was taken off Celebrex by her primary care physician due to question of abnormal renal function. States that she had been on Celebrex for several years. She is also on chronic Coumadin. History of diabetes and is currently on single oral med.  Bilateral knee pain with ambulation squatting and stairs. No injury. She is requesting bilateral knee injections today. Fingerstick glucose at home this morning 154 but states that that was before she took her medication. No complaints of hip pain. No lumbar spine or radicular component. Review  of Systems  Constitutional: Positive for activity change.  HENT: Negative.   Respiratory: Negative.   Gastrointestinal: Negative.   Genitourinary: Negative.   Musculoskeletal: Positive for arthralgias, gait problem and joint swelling.  Psychiatric/Behavioral: Negative.      Objective: Vital Signs: BP (!) 114/58 (BP Location: Left Arm, Patient Position: Sitting, Cuff Size: Large)   Pulse 79   Ht 5\' 4"  (1.626 m)   Wt 275 lb (124.7 kg)   BMI 47.20 kg/m   Physical Exam  Constitutional: She is oriented to person,  place, and time. She appears well-developed. No distress.  HENT:  Head: Normocephalic.  Eyes: EOM are normal.  Neck: Normal range of motion.  Pulmonary/Chest: No respiratory distress.  Neurological: She is alert and oriented to person, place, and time.  Skin: Skin is warm.  Psychiatric: She has a normal mood and affect.    Ortho Exam  Specialty Comments:  No specialty comments available.  Imaging: No results found.   PMFS History: There are no active problems to display for this patient.  No past medical history on file.  No family history on file.  No past surgical history on file. Social History   Occupational History  . Not on file.   Social History Main Topics  . Smoking status: Never Smoker  . Smokeless tobacco: Never Used  . Alcohol use No  . Drug use: No  . Sexual activity: Not on file

## 2016-06-02 NOTE — Progress Notes (Signed)
7.9%

## 2016-06-09 ENCOUNTER — Telehealth (INDEPENDENT_AMBULATORY_CARE_PROVIDER_SITE_OTHER): Payer: Self-pay | Admitting: Specialist

## 2016-06-09 ENCOUNTER — Other Ambulatory Visit (INDEPENDENT_AMBULATORY_CARE_PROVIDER_SITE_OTHER): Payer: Self-pay | Admitting: Specialist

## 2016-06-09 DIAGNOSIS — M48062 Spinal stenosis, lumbar region with neurogenic claudication: Secondary | ICD-10-CM

## 2016-06-09 NOTE — Telephone Encounter (Signed)
PT CALLED AND ASKED IF YOU CAN SEND IN ANOTHER SCRIPT FOR A LARGER WALKER. PT STATED THE ONE SHE RECEIVED WAS TOO NARROW IN HER HIP AREA AND IT IS VERY UNCOMFORTABLE.  PLEASE ADVISE  [phone]  FAX TO [phone]

## 2016-06-09 NOTE — Telephone Encounter (Signed)
PT CALLED AND ASKED IF YOU CAN SEND IN ANOTHER SCRIPT FOR A LARGER WALKER. PT STATED THE ONE SHE RECEIVED WAS TOO NARROW IN HER HIP AREA AND IT IS VERY UNCOMFORTABLE.  PLEASE ADVISE

## 2016-06-10 NOTE — Progress Notes (Signed)
This is another one, see if Rx is there? See Dr Otho Ket note

## 2016-06-10 NOTE — Telephone Encounter (Signed)
Rx faxed for her, IC her and advised.

## 2016-06-10 NOTE — Telephone Encounter (Signed)
DME printed and needs to be faxed or mailed to patient.

## 2016-06-10 NOTE — Telephone Encounter (Signed)
This is the other Rx that was needing to be signed, see Dr Otho Ket note.

## 2016-06-13 ENCOUNTER — Telehealth (INDEPENDENT_AMBULATORY_CARE_PROVIDER_SITE_OTHER): Payer: Self-pay | Admitting: Specialist

## 2016-06-13 NOTE — Telephone Encounter (Signed)
06/03/2106 OV NOTE FAXED TO [facility] [phone]

## 2016-06-24 NOTE — Telephone Encounter (Signed)
PT ASKED IF WE CAN REFAX RX FOR HER NEW CHAIR PLEASE.

## 2016-06-24 NOTE — Telephone Encounter (Signed)
Order faxed and pt is aware

## 2016-07-05 ENCOUNTER — Telehealth (INDEPENDENT_AMBULATORY_CARE_PROVIDER_SITE_OTHER): Payer: Self-pay | Admitting: Specialist

## 2016-07-05 NOTE — Telephone Encounter (Signed)
Patient called asking for Dr. Louanne Skye to send a RX for a larger walker to [facility]. She says the one she has now is too small for her and they wont give her a larger one unless she's over 300 lbs. CB # [phone] Fernando Salinas Fax # [phone]

## 2016-07-05 NOTE — Telephone Encounter (Signed)
Patient called asking for Dr. Louanne Skye to send a RX for a larger walker to [facility]. She says the one she has now is too small for her and they wont give her a larger one unless she's over 300 lbs

## 2016-07-07 ENCOUNTER — Telehealth (INDEPENDENT_AMBULATORY_CARE_PROVIDER_SITE_OTHER): Payer: Self-pay

## 2016-07-07 NOTE — Telephone Encounter (Signed)
Patient called again concerning Rx for a larger walker.  CB # [phone]

## 2016-07-08 ENCOUNTER — Telehealth (INDEPENDENT_AMBULATORY_CARE_PROVIDER_SITE_OTHER): Payer: Self-pay | Admitting: Radiology

## 2016-07-08 ENCOUNTER — Other Ambulatory Visit (INDEPENDENT_AMBULATORY_CARE_PROVIDER_SITE_OTHER): Payer: Self-pay | Admitting: Specialist

## 2016-07-08 DIAGNOSIS — M6281 Muscle weakness (generalized): Secondary | ICD-10-CM

## 2016-07-08 DIAGNOSIS — M48062 Spinal stenosis, lumbar region with neurogenic claudication: Secondary | ICD-10-CM

## 2016-07-08 NOTE — Telephone Encounter (Signed)
I called and advised that I had faxed new order to [facility]

## 2016-07-08 NOTE — Telephone Encounter (Signed)
Rx for walker placed and it will need to be faxed to the place that Mrs. Maragh prefers. Or we can mail to her and she may be able to handle it.

## 2016-07-08 NOTE — Telephone Encounter (Signed)
I called and advised that I had faxed new order to [facility]

## 2016-07-08 NOTE — Telephone Encounter (Signed)
Patient is needing a letter Faxed to [facility] as to why she needs the Bariatric 4-wheeled rolling walker with seat. ---If you can do the letter/note, just let me know when this is done, and I will fax it to them.  Thanks

## 2016-07-08 NOTE — Progress Notes (Signed)
I called and advised that I had faxed new order to [facility]

## 2016-07-11 ENCOUNTER — Ambulatory Visit (INDEPENDENT_AMBULATORY_CARE_PROVIDER_SITE_OTHER): Payer: BLUE CROSS/BLUE SHIELD | Admitting: Specialist

## 2016-07-14 NOTE — Telephone Encounter (Signed)
Discussed with christy. Patient does not meet criteria for this walker.  Patient was advised.

## 2016-08-03 ENCOUNTER — Encounter (INDEPENDENT_AMBULATORY_CARE_PROVIDER_SITE_OTHER): Payer: Self-pay

## 2016-08-03 ENCOUNTER — Ambulatory Visit (INDEPENDENT_AMBULATORY_CARE_PROVIDER_SITE_OTHER): Payer: Medicare Other | Admitting: Specialist

## 2016-08-19 ENCOUNTER — Other Ambulatory Visit (INDEPENDENT_AMBULATORY_CARE_PROVIDER_SITE_OTHER): Payer: Self-pay | Admitting: *Deleted

## 2016-08-19 DIAGNOSIS — M7582 Other shoulder lesions, left shoulder: Secondary | ICD-10-CM

## 2016-08-19 DIAGNOSIS — M7061 Trochanteric bursitis, right hip: Secondary | ICD-10-CM

## 2016-08-19 DIAGNOSIS — G5603 Carpal tunnel syndrome, bilateral upper limbs: Secondary | ICD-10-CM

## 2016-08-19 DIAGNOSIS — M778 Other enthesopathies, not elsewhere classified: Secondary | ICD-10-CM

## 2016-08-19 NOTE — Telephone Encounter (Signed)
Pt called asking for tramadol Rx to be faxed to [facility]

## 2016-08-22 MED ORDER — TRAMADOL HCL 50 MG PO TABS: 100.0000 mg | ORAL_TABLET | Freq: Four times a day (QID) | ORAL | 2 refills | Status: DC | PRN

## 2016-08-23 NOTE — Telephone Encounter (Signed)
[facility] [address] [place], [place]

## 2016-10-05 ENCOUNTER — Ambulatory Visit (INDEPENDENT_AMBULATORY_CARE_PROVIDER_SITE_OTHER): Payer: Medicare Other | Admitting: Specialist

## 2016-10-05 ENCOUNTER — Encounter (INDEPENDENT_AMBULATORY_CARE_PROVIDER_SITE_OTHER): Payer: Self-pay | Admitting: Specialist

## 2016-10-05 VITALS — BP 131/71 | HR 88 | Ht 64.0 in | Wt 275.0 lb

## 2016-10-05 DIAGNOSIS — M174 Other bilateral secondary osteoarthritis of knee: Secondary | ICD-10-CM | POA: Diagnosis not present

## 2016-10-05 DIAGNOSIS — M7061 Trochanteric bursitis, right hip: Secondary | ICD-10-CM

## 2016-10-05 DIAGNOSIS — M778 Other enthesopathies, not elsewhere classified: Secondary | ICD-10-CM

## 2016-10-05 DIAGNOSIS — M7062 Trochanteric bursitis, left hip: Secondary | ICD-10-CM | POA: Diagnosis not present

## 2016-10-05 DIAGNOSIS — M7582 Other shoulder lesions, left shoulder: Secondary | ICD-10-CM | POA: Diagnosis not present

## 2016-10-05 DIAGNOSIS — G5603 Carpal tunnel syndrome, bilateral upper limbs: Secondary | ICD-10-CM

## 2016-10-05 MED ORDER — TRAMADOL HCL 50 MG PO TABS: 100.0000 mg | ORAL_TABLET | Freq: Four times a day (QID) | ORAL | 0 refills | Status: DC | PRN

## 2016-10-05 NOTE — Patient Instructions (Signed)
Knee is suffering from osteoarthritis, only real proven treatments are Weight loss, and exercise. Well padded shoes help. Ice the knee 2-3 times a day 15-20 mins at a time.

## 2016-10-05 NOTE — Progress Notes (Signed)
Office Visit Note   Patient: [name]           Date of Birth: Nov 23, 1950           MRN: [id number] Visit Date: 10/05/2016              Requested by: No referring provider defined for this encounter. PCP: Galen Manila, MD   Assessment & Plan: Visit Diagnoses:  1. Other bilateral secondary osteoarthritis of knee   2. Trochanteric bursitis, left hip   3. Greater trochanteric bursitis, right   4. Shoulder tendonitis, left   5. Carpal tunnel syndrome, bilateral     Plan:Knee is suffering from osteoarthritis, only real proven treatments are Weight loss, and exercise. Well padded shoes help. Ice the knee 2-3 times a day 15-20 mins at a time.   Follow-Up Instructions: Return in about 6 weeks (around 11/16/2016).        Procedures: Large Joint Inj Date/Time: 10/05/2016 1:20 PM Performed by: Jessy Oto Authorized by: Jessy Oto   Consent Given by:  Patient Indications:  Pain Location:  Knee Site:  L knee Prep: patient was prepped and draped in usual sterile fashion   Needle Size:  25 G Needle Length:  1.5 inches Approach:  Anterolateral Ultrasound Guidance: No   Fluoroscopic Guidance: No   Arthrogram: No   Medications:  40 mg methylPREDNISolone acetate 40 MG/ML; 4 mL bupivacaine 0.25 % Aspiration Attempted: No   Patient tolerance:  Patient tolerated the procedure well with no immediate complications  Band aid applied. Large Joint Inj Date/Time: 10/05/2016 1:21 PM Performed by: Jessy Oto Authorized by: Jessy Oto   Consent Given by:  Patient Site marked: the procedure site was marked   Timeout: prior to procedure the correct patient, procedure, and site was verified   Indications:  Pain Location:  Knee Site:  R knee Prep: patient was prepped and draped in usual sterile fashion   Needle Size:  25 G Needle Length:  1.5 inches Approach:  Anterolateral Ultrasound Guidance: No   Fluoroscopic Guidance: No   Arthrogram: No     Medications:  40 mg methylPREDNISolone acetate 40 MG/ML; 4 mL bupivacaine 0.25 % Aspiration Attempted: No   Patient tolerance:  Patient tolerated the procedure well with no immediate complications  Band aid applied Large Joint Inj Date/Time: 10/05/2016 1:23 PM Performed by: Jessy Oto Authorized by: Jessy Oto   Consent Given by:  Patient Indications:  Pain Location:  Hip Site:  L greater trochanter Prep: patient was prepped and draped in usual sterile fashion   Needle Size:  22 G Needle Length:  3.5 inches Approach:  Lateral Ultrasound Guidance: No   Fluoroscopic Guidance: No   Arthrogram: No   Medications:  40 mg methylPREDNISolone acetate 40 MG/ML; 4 mL bupivacaine 0.25 % Aspiration Attempted: No   Patient tolerance:  Patient tolerated the procedure well with no immediate complications  Bandaid applied.     Clinical Data: No additional findings.   Subjective: Chief Complaint  Patient presents with  . Right Knee - Follow-up, Injections  . Left Knee - Follow-up, Injections    66 year old female post ALIF L4-5 and previous attempted L4-5 TLIF with cage anterior displacement now this healed ALIF    Review of Systems  Constitutional: Negative.   HENT: Negative.   Eyes: Negative.   Respiratory: Negative.   Cardiovascular: Negative.   Gastrointestinal: Negative.   Endocrine: Negative.   Genitourinary: Negative.  Musculoskeletal: Negative.   Skin: Negative.   Allergic/Immunologic: Negative.   Neurological: Negative.   Hematological: Negative.   Psychiatric/Behavioral: Negative.      Objective: Vital Signs: BP 131/71 (BP Location: Left Arm, Patient Position: Sitting)   Pulse 88   Ht 5\' 4"  (1.626 m)   Wt 275 lb (124.7 kg)   BMI 47.20 kg/m   Physical Exam  Constitutional: She is oriented to person, place, and time. She appears well-developed and well-nourished.  HENT:  Head: Normocephalic and atraumatic.  Eyes: Pupils are equal, round, and  reactive to light. EOM are normal.  Neck: Normal range of motion. Neck supple.  Pulmonary/Chest: Effort normal and breath sounds normal.  Abdominal: Soft. Bowel sounds are normal.  Musculoskeletal: Normal range of motion.       Right knee: She exhibits effusion.       Left knee: She exhibits effusion.  Neurological: She is alert and oriented to person, place, and time.  Skin: Skin is warm and dry.  Psychiatric: She has a normal mood and affect. Her behavior is normal. Judgment and thought content normal.    Right Knee Exam   Tenderness  The patient is experiencing tenderness in the lateral joint line and patella.  Range of Motion  Extension: normal  Flexion: normal   Tests  McMurray:  Medial - negative Lateral - negative Lachman:  Anterior - negative    Posterior - negative Drawer:       Anterior - negative    Posterior - negative Varus: negative Valgus: negative Pivot Shift: negative Patellar Apprehension: negative  Other  Erythema: absent Scars: absent Sensation: normal Pulse: present Swelling: mild Other tests: effusion present   Left Knee Exam   Tenderness  The patient is experiencing tenderness in the lateral joint line and patellar tendon.  Tests  McMurray:  Medial - negative Lateral - negative Lachman:  Anterior - negative    Posterior - negative Drawer:       Anterior - negative     Posterior - negative Varus: negative Valgus: negative Pivot Shift: negative Patellar Apprehension: negative  Other  Erythema: absent Scars: absent Sensation: normal Pulse: present Swelling: mild Effusion: effusion present      Specialty Comments:  No specialty comments available.  Imaging: No results found.   PMFS History: There are no active problems to display for this patient.  No past medical history on file.  No family history on file.  No past surgical history on file. Social History   Occupational History  . Not on file.   Social History  Main Topics  . Smoking status: Never Smoker  . Smokeless tobacco: Never Used  . Alcohol use No  . Drug use: No  . Sexual activity: Not on file

## 2016-11-21 ENCOUNTER — Ambulatory Visit (INDEPENDENT_AMBULATORY_CARE_PROVIDER_SITE_OTHER): Payer: Medicare Other | Admitting: Specialist

## 2016-12-12 ENCOUNTER — Encounter (INDEPENDENT_AMBULATORY_CARE_PROVIDER_SITE_OTHER): Payer: Self-pay | Admitting: Specialist

## 2016-12-12 MED ORDER — BUPIVACAINE HCL 0.25 % IJ SOLN
4.0000 mL | INTRAMUSCULAR | Status: AC | PRN
  Administered 2016-10-05: 4 mL via INTRA_ARTICULAR

## 2016-12-12 MED ORDER — METHYLPREDNISOLONE ACETATE 40 MG/ML IJ SUSP
40.0000 mg | INTRAMUSCULAR | Status: AC | PRN
  Administered 2016-10-05: 40 mg via INTRA_ARTICULAR

## 2017-04-20 ENCOUNTER — Ambulatory Visit (INDEPENDENT_AMBULATORY_CARE_PROVIDER_SITE_OTHER): Payer: Medicare Other

## 2017-04-20 ENCOUNTER — Ambulatory Visit (INDEPENDENT_AMBULATORY_CARE_PROVIDER_SITE_OTHER): Payer: Medicare Other | Admitting: Specialist

## 2017-04-20 ENCOUNTER — Encounter (INDEPENDENT_AMBULATORY_CARE_PROVIDER_SITE_OTHER): Payer: Self-pay | Admitting: Specialist

## 2017-04-20 VITALS — BP 127/69 | HR 73

## 2017-04-20 DIAGNOSIS — M7062 Trochanteric bursitis, left hip: Secondary | ICD-10-CM

## 2017-04-20 DIAGNOSIS — M7582 Other shoulder lesions, left shoulder: Secondary | ICD-10-CM | POA: Diagnosis not present

## 2017-04-20 DIAGNOSIS — M25572 Pain in left ankle and joints of left foot: Secondary | ICD-10-CM

## 2017-04-20 DIAGNOSIS — M7061 Trochanteric bursitis, right hip: Secondary | ICD-10-CM

## 2017-04-20 DIAGNOSIS — M174 Other bilateral secondary osteoarthritis of knee: Secondary | ICD-10-CM | POA: Diagnosis not present

## 2017-04-20 DIAGNOSIS — M216X1 Other acquired deformities of right foot: Secondary | ICD-10-CM | POA: Diagnosis not present

## 2017-04-20 DIAGNOSIS — G5603 Carpal tunnel syndrome, bilateral upper limbs: Secondary | ICD-10-CM | POA: Diagnosis not present

## 2017-04-20 DIAGNOSIS — M216X2 Other acquired deformities of left foot: Secondary | ICD-10-CM | POA: Diagnosis not present

## 2017-04-20 DIAGNOSIS — M778 Other enthesopathies, not elsewhere classified: Secondary | ICD-10-CM

## 2017-04-20 MED ORDER — TRAMADOL HCL 50 MG PO TABS: 100.0000 mg | ORAL_TABLET | Freq: Four times a day (QID) | ORAL | 0 refills | Status: DC | PRN

## 2017-04-20 NOTE — Patient Instructions (Signed)
Plan: The main ways of treat osteoarthritis, that are found to be success. Weight loss helps to decrease pain. Exercise is important to maintaining cartilage and thickness and strengthening. NSAIDs like tylenol are meds decreasing the inflamation. Ice is okay  In afternoon and evening and hot shower in the am Ultram for pain. Obtain new shoes with stiff shoe and adequate arch support. We recommend the [employer], [address] [place]. Obtain shoe inserts to support the arch and the ankles to prevent deformity. Start exercises to stretch the heel cords and to strengthen the posterior tibial tendons.

## 2017-04-20 NOTE — Progress Notes (Signed)
Office Visit Note   Patient: [name]           Date of Birth: 1950/08/22           MRN: [id number] Visit Date: 04/20/2017              Requested by: Galen Manila, [place] [address] PCP: Galen Manila, MD   Assessment & Plan: Visit Diagnoses:  1. Pain in left ankle and joints of left foot   2. Planovalgus deformity of foot, acquired, left   3. Acquired planovalgus deformity of right foot   4. Greater trochanteric bursitis, right   5. Shoulder tendonitis, left   6. Carpal tunnel syndrome, bilateral   7. Other bilateral secondary osteoarthritis of knee   8. Trochanteric bursitis, left hip   67 year old female with valgus OA of the knees and developing valgus OA of the right ankle and significant  Planovalgus foot deformity. Will try orthotics and tylenol. Due to use of anticoagulant she is not able to use NSAIDS And should only take tylenol. I've also recommended  Stiff lasted shoe with an UCB insert. If her pain persists then She will need to see Dr. Sharol Given for consideration of a more Formidable AFO vs surgery.   Plan: The main ways of treat osteoarthritis, that are found to be success. Weight loss helps to decrease pain. Exercise is important to maintaining cartilage and thickness and strengthening. NSAIDs like tylenol are meds decreasing the inflamation. Ice is okay  In afternoon and evening and hot shower in the am Ultram for pain. Obtain new shoes with stiff shoe and adequate arch support. We recommend the [employer], [address] [place]. Obtain shoe inserts to support the arch and the ankles to prevent deformity. Start exercises to stretch the heel cords and to strengthen the posterior tibial tendons.    Follow-Up Instructions: No Follow-up on file.   Orders:  Orders Placed This Encounter  Procedures  . XR Foot Complete Left   No orders of the defined types were placed in this encounter.     Procedures: Large Joint Inj: bilateral knee on 04/20/2017 12:20 PM Indications: pain Details: 25 G 1.5 in needle, anterolateral approach  Arthrogram: No  Medications (Right): 4 mL bupivacaine 0.25 %; 40 mg methylPREDNISolone acetate 40 MG/ML Medications (Left): 4 mL bupivacaine 0.25 %; 40 mg methylPREDNISolone acetate 40 MG/ML Outcome: tolerated well, no immediate complications Procedure, treatment alternatives, risks and benefits explained, specific risks discussed. Consent was given by the patient. Immediately prior to procedure a time out was called to verify the correct patient, procedure, equipment, support staff and site/side marked as required. Patient was prepped and draped in the usual sterile fashion.       Clinical Data: No additional findings.   Subjective: Chief Complaint  Patient presents with  . Left Knee - Pain  . Right Knee - Pain  . Left Foot - Pain    HPI  Review of Systems  Constitutional: Negative.   HENT: Negative.   Eyes: Negative.   Respiratory: Negative.   Cardiovascular: Negative.   Gastrointestinal: Negative.   Endocrine: Negative.   Genitourinary: Negative.   Musculoskeletal: Negative.   Skin: Negative.   Allergic/Immunologic: Negative.   Neurological: Negative.   Hematological: Negative.   Psychiatric/Behavioral: Negative.      Objective: Vital Signs: BP 127/69   Pulse 73   Physical Exam  Constitutional: She is oriented to person, place, and time.  She appears well-developed and well-nourished.  HENT:  Head: Normocephalic and atraumatic.  Eyes: EOM are normal. Pupils are equal, round, and reactive to light.  Neck: Normal range of motion. Neck supple.  Pulmonary/Chest: Effort normal and breath sounds normal.  Abdominal: Soft. Bowel sounds are normal.  Musculoskeletal: Normal range of motion.  Neurological: She is alert and oriented to person, place, and time.  Skin: Skin is warm and dry.  Psychiatric: She has a normal mood and  affect. Her behavior is normal. Judgment and thought content normal.    Back Exam   Tenderness  The patient is experiencing tenderness in the lumbar.  Range of Motion  Extension: normal  Flexion: normal  Lateral bend right: normal  Lateral bend left: normal  Rotation right: normal  Rotation left: normal   Muscle Strength  Right Quadriceps:  5/5  Right Hamstrings:  5/5  Left Hamstrings:  5/5   Tests  Straight leg raise right: negative Straight leg raise left: negative  Reflexes  Patellar: abnormal Achilles: abnormal Babinski's sign: normal   Other  Toe walk: abnormal Heel walk: abnormal Sensation: decreased Erythema: no back redness Scars: absent  Comments:  Bilateral heel valgus with standing and planus grade 0. There is swelling both legs to the level of the shoe that is moderate.       Specialty Comments:  No specialty comments available.  Imaging: No results found.   PMFS History: There are no active problems to display for this patient.  History reviewed. No pertinent past medical history.  History reviewed. No pertinent family history.  History reviewed. No pertinent surgical history. Social History   Occupational History  . Not on file  Tobacco Use  . Smoking status: Never Smoker  . Smokeless tobacco: Never Used  Substance and Sexual Activity  . Alcohol use: No  . Drug use: No  . Sexual activity: Not on file

## 2017-04-21 ENCOUNTER — Encounter (INDEPENDENT_AMBULATORY_CARE_PROVIDER_SITE_OTHER): Payer: Self-pay | Admitting: Specialist

## 2017-04-21 MED ORDER — BUPIVACAINE HCL 0.25 % IJ SOLN
4.0000 mL | INTRAMUSCULAR | Status: AC | PRN
  Administered 2017-04-20: 4 mL via INTRA_ARTICULAR

## 2017-04-21 MED ORDER — METHYLPREDNISOLONE ACETATE 40 MG/ML IJ SUSP
40.0000 mg | INTRAMUSCULAR | Status: AC | PRN
Start: 2017-04-20 — End: 2017-04-20
  Administered 2017-04-20: 40 mg via INTRA_ARTICULAR

## 2017-04-21 MED ORDER — METHYLPREDNISOLONE ACETATE 40 MG/ML IJ SUSP
40.0000 mg | INTRAMUSCULAR | Status: AC | PRN
  Administered 2017-04-20: 40 mg via INTRA_ARTICULAR

## 2017-06-22 ENCOUNTER — Encounter (INDEPENDENT_AMBULATORY_CARE_PROVIDER_SITE_OTHER): Payer: Self-pay | Admitting: Surgery

## 2017-06-22 ENCOUNTER — Ambulatory Visit (INDEPENDENT_AMBULATORY_CARE_PROVIDER_SITE_OTHER): Payer: Medicare Other | Admitting: Surgery

## 2017-06-22 ENCOUNTER — Ambulatory Visit (INDEPENDENT_AMBULATORY_CARE_PROVIDER_SITE_OTHER): Payer: Medicare Other

## 2017-06-22 VITALS — BP 124/65 | HR 75 | Ht 64.0 in | Wt 286.0 lb

## 2017-06-22 DIAGNOSIS — M25561 Pain in right knee: Secondary | ICD-10-CM

## 2017-06-22 DIAGNOSIS — M25562 Pain in left knee: Secondary | ICD-10-CM | POA: Diagnosis not present

## 2017-06-22 DIAGNOSIS — G8929 Other chronic pain: Secondary | ICD-10-CM | POA: Diagnosis not present

## 2017-06-22 MED ORDER — LIDOCAINE HCL 1 % IJ SOLN
3.0000 mL | INTRAMUSCULAR | Status: AC | PRN
  Administered 2017-06-22: 3 mL

## 2017-06-22 MED ORDER — BUPIVACAINE HCL 0.25 % IJ SOLN
6.0000 mL | INTRAMUSCULAR | Status: AC | PRN
  Administered 2017-06-22: 6 mL via INTRA_ARTICULAR

## 2017-06-22 MED ORDER — METHYLPREDNISOLONE ACETATE 40 MG/ML IJ SUSP
40.0000 mg | INTRAMUSCULAR | Status: AC | PRN
  Administered 2017-06-22: 40 mg via INTRA_ARTICULAR

## 2017-06-22 NOTE — Progress Notes (Signed)
Office Visit Note   Patient: [name]           Date of Birth: [birth year]           MRN: [id number] Visit Date: 06/22/2017              Requested by: Galen Manila, [place] [address] PCP: Galen Manila, MD   Assessment & Plan: Visit Diagnoses:  1. Chronic pain of both knees     Plan: Today I did elect to reinject the right knee.  After patient consent right knee was prepped with Betadine and intra-articular Marcaine/Depo-Medrol injection was performed.  patient will follow-up in a few weeks for recheck and I will plan to start Orthovisc series for both knees.  Again I did advise patient that ultimately it may come down to her needing definitive treatment with right total knee replacement but she is obviously wanting to exhaust all conservative measures before going that route.  Patient also instructed to strictly monitor her blood sugars due to the potential for today's injection to cause elevation.  Follow-Up Instructions: Return if symptoms worsen or fail to improve.   Orders:  Orders Placed This Encounter  Procedures  . XR KNEE 3 VIEW RIGHT  . XR KNEE 3 VIEW LEFT   No orders of the defined types were placed in this encounter.     Procedures: Large Joint Inj: R knee on 06/22/2017 11:52 AM Indications: pain and joint swelling Details: 25 G needle, anteromedial approach Medications: 3 mL lidocaine 1 %; 6 mL bupivacaine 0.25 %; 40 mg methylPREDNISolone acetate 40 MG/ML Consent was given by the patient. Patient was prepped and draped in the usual sterile fashion.       Clinical Data: No additional findings.   Subjective: Chief Complaint  Patient presents with  . Right Knee - Pain  . Left Knee - Pain    HPI Patient is in today with complaints of right greater left knee pain secondary to DJD.  She was last seen in the office by Dr. Louanne Skye March 2019 and both knees were injected with Marcaine/Depo-Medrol.  She is requesting  repeat injections.  Has not had x-rays of her knees since April 2018.  Currently has knee pain with ambulating, squatting, and going from a sitting to standing position.  Patient is currently wearing a boot on the left foot due to ongoing issues with ankle tendon insufficiency.  She is planning on having surgery up in [place] for this with reconstruction. Review of Systems No current cardiac pulmonary GI GU issues  Objective: Vital Signs: BP 124/65 (BP Location: Right Arm, Patient Position: Sitting, Cuff Size: Large)   Pulse 75   Ht 5\' 4"  (1.626 m)   Wt 286 lb (129.7 kg)   BMI 49.09 kg/m   Physical Exam  Constitutional: She is oriented to person, place, and time. No distress.  HENT:  Head: Normocephalic and atraumatic.  Eyes: Pupils are equal, round, and reactive to light.  Neck: Normal range of motion.  Pulmonary/Chest: No respiratory distress.  Musculoskeletal:  Exam bilateral knees good range of motion.  Right knee positive patellofemoral crepitus.  Positive patellar grind.  Medial lateral joint line markedly tender.  Ligaments are stable.  Left knee ligament stable.  Less crepitus.  Joint line less tender.  Bilateral calves are nontender.  Neurovascular intact.  Neurological: She is alert and oriented to person, place, and time.  Skin: Skin is warm and dry.  Psychiatric:  She has a normal mood and affect.    Ortho Exam  Specialty Comments:  No specialty comments available.  Imaging: No results found.   PMFS History: There are no active problems to display for this patient.  No past medical history on file.  No family history on file.  No past surgical history on file. Social History   Occupational History  . Not on file  Tobacco Use  . Smoking status: Never Smoker  . Smokeless tobacco: Never Used  Substance and Sexual Activity  . Alcohol use: No  . Drug use: No  . Sexual activity: Not on file

## 2017-06-28 ENCOUNTER — Telehealth (INDEPENDENT_AMBULATORY_CARE_PROVIDER_SITE_OTHER): Payer: Self-pay

## 2017-06-28 NOTE — Telephone Encounter (Signed)
Submitted application online for Orthovisc injection, bilateral knee.

## 2017-06-30 ENCOUNTER — Other Ambulatory Visit (INDEPENDENT_AMBULATORY_CARE_PROVIDER_SITE_OTHER): Payer: Self-pay | Admitting: Specialist

## 2017-06-30 DIAGNOSIS — G5603 Carpal tunnel syndrome, bilateral upper limbs: Secondary | ICD-10-CM

## 2017-06-30 DIAGNOSIS — M7582 Other shoulder lesions, left shoulder: Secondary | ICD-10-CM

## 2017-06-30 DIAGNOSIS — M778 Other enthesopathies, not elsewhere classified: Secondary | ICD-10-CM

## 2017-06-30 DIAGNOSIS — M7061 Trochanteric bursitis, right hip: Secondary | ICD-10-CM

## 2017-06-30 DIAGNOSIS — M7062 Trochanteric bursitis, left hip: Secondary | ICD-10-CM

## 2017-06-30 DIAGNOSIS — M174 Other bilateral secondary osteoarthritis of knee: Secondary | ICD-10-CM

## 2017-06-30 MED ORDER — TRAMADOL HCL 50 MG PO TABS: 100.0000 mg | ORAL_TABLET | Freq: Four times a day (QID) | ORAL | 0 refills | Status: DC | PRN

## 2017-06-30 NOTE — Telephone Encounter (Signed)
[facility] 

## 2017-06-30 NOTE — Telephone Encounter (Signed)
Patient called needing Rx refilled (Tramadol) The number to contact patient is [phone]

## 2017-07-21 ENCOUNTER — Telehealth (INDEPENDENT_AMBULATORY_CARE_PROVIDER_SITE_OTHER): Payer: Self-pay | Admitting: Radiology

## 2017-07-21 NOTE — Telephone Encounter (Signed)
Approved for orthovisc injection. LMOM for patient to call back and schedule

## 2017-08-25 ENCOUNTER — Telehealth (INDEPENDENT_AMBULATORY_CARE_PROVIDER_SITE_OTHER): Payer: Self-pay | Admitting: Specialist

## 2017-08-25 NOTE — Telephone Encounter (Signed)
Patient called requesting an RX refill on her Tramadol.  [facility] [place] [place]  CB#[phone].  Thank you.

## 2017-08-25 NOTE — Telephone Encounter (Signed)
Ok to rf? 

## 2017-08-31 ENCOUNTER — Encounter (INDEPENDENT_AMBULATORY_CARE_PROVIDER_SITE_OTHER): Payer: Self-pay | Admitting: Surgery

## 2017-08-31 ENCOUNTER — Telehealth (INDEPENDENT_AMBULATORY_CARE_PROVIDER_SITE_OTHER): Payer: Self-pay

## 2017-08-31 ENCOUNTER — Ambulatory Visit (INDEPENDENT_AMBULATORY_CARE_PROVIDER_SITE_OTHER): Payer: Medicare Other | Admitting: Surgery

## 2017-08-31 VITALS — Ht 65.0 in

## 2017-08-31 DIAGNOSIS — M1712 Unilateral primary osteoarthritis, left knee: Secondary | ICD-10-CM | POA: Diagnosis not present

## 2017-08-31 DIAGNOSIS — M1711 Unilateral primary osteoarthritis, right knee: Secondary | ICD-10-CM | POA: Diagnosis not present

## 2017-08-31 NOTE — Telephone Encounter (Signed)
Patient approved for Orthovisc series, bilateral knee. Buy & Bill No PA required.

## 2017-08-31 NOTE — Progress Notes (Signed)
   Office Visit Note   Patient: [name]           Date of Birth: [birth year]           MRN: [id number] Visit Date: 08/31/2017              Requested by: Galen Manila, [place] [address] PCP: Galen Manila, MD   Assessment & Plan: Visit Diagnoses:  1. Unilateral primary osteoarthritis, right knee   2. Unilateral primary osteoarthritis, left knee     Plan: Today I started Orthovisc series as previously discussed last office visit.  Patient sent bile knees were prepped with Betadine and Orthovisc injections #1 of 3 were performed.  Patient follow-up in 1 week for injections #2.  Again patient understands that ultimately will come down her needing total knee replacements but she is waiting on the scheduling of left foot reconstructive surgery.  This is to be done by physician and [place].  I did discuss with her that if she wanted a second opinion I can get her an appointment with Dr. Meridee Score here in our clinic.  She will let me know next week.  Follow-Up Instructions: Return in about 1 week (around 09/07/2017) for with [facility] for injection.   Orders:  No orders of the defined types were placed in this encounter.  No orders of the defined types were placed in this encounter.     Procedures: No procedures performed   Clinical Data: No additional findings.   Subjective: Chief Complaint  Patient presents with  . Left Knee - Pain    HPI Patient with history of right greater than left knee DJD returns requesting left knee cortisone injection.  Patient was last seen by me May 2019 and I performed right knee intra-articular Marcaine/Depo-Medrol injection at that time.  Patient seen by Dr. Louanne Skye March 2019 and he performed bilateral knee cortisone injections at that time.  We have had long discussion with the patient advising that ultimately come down her needing total knee replacement.  She is hoping to be able to put that off until she  has left foot reconstructive surgery by physician in [place].  Both knees continue to be painful with ambulation.  Last office visit I had discussed starting Orthovisc series and we do have approval for that. Review of Systems No current cardiac pulmonary GI GU issues  Objective: Vital Signs: Ht 5\' 5"  (1.651 m)   BMI 47.59 kg/m   Physical Exam  Constitutional: No distress.  HENT:  Head: Normocephalic.  Eyes: EOM are normal.  Pulmonary/Chest: No respiratory distress.  Musculoskeletal:  Gait is somewhat antalgic with a cane.  Bilateral knees some swelling.  No significant effusions.  Positive crepitus.  Joint line tender.  Calves nontender.  Neurological: She is alert.  Skin: Skin is warm and dry.    Ortho Exam  Specialty Comments:  No specialty comments available.  Imaging: No results found.   PMFS History: There are no active problems to display for this patient.  History reviewed. No pertinent past medical history.  History reviewed. No pertinent family history.  History reviewed. No pertinent surgical history. Social History   Occupational History  . Not on file  Tobacco Use  . Smoking status: Never Smoker  . Smokeless tobacco: Never Used  Substance and Sexual Activity  . Alcohol use: No  . Drug use: No  . Sexual activity: Not on file

## 2017-09-01 ENCOUNTER — Other Ambulatory Visit (INDEPENDENT_AMBULATORY_CARE_PROVIDER_SITE_OTHER): Payer: Self-pay | Admitting: Specialist

## 2017-09-01 DIAGNOSIS — G5603 Carpal tunnel syndrome, bilateral upper limbs: Secondary | ICD-10-CM

## 2017-09-01 DIAGNOSIS — M778 Other enthesopathies, not elsewhere classified: Secondary | ICD-10-CM

## 2017-09-01 DIAGNOSIS — M7582 Other shoulder lesions, left shoulder: Secondary | ICD-10-CM

## 2017-09-01 DIAGNOSIS — M174 Other bilateral secondary osteoarthritis of knee: Secondary | ICD-10-CM

## 2017-09-01 DIAGNOSIS — M7061 Trochanteric bursitis, right hip: Secondary | ICD-10-CM

## 2017-09-01 DIAGNOSIS — M7062 Trochanteric bursitis, left hip: Secondary | ICD-10-CM

## 2017-09-01 MED ORDER — TRAMADOL HCL 50 MG PO TABS: 100.0000 mg | ORAL_TABLET | Freq: Four times a day (QID) | ORAL | 0 refills | Status: DC | PRN

## 2017-09-01 NOTE — Telephone Encounter (Signed)
Do you have rx?

## 2017-09-01 NOTE — Telephone Encounter (Signed)
Called to Pharmacy

## 2017-09-01 NOTE — Telephone Encounter (Signed)
Rx printed please call this medication into her pharmacy. jen

## 2017-09-07 ENCOUNTER — Encounter (INDEPENDENT_AMBULATORY_CARE_PROVIDER_SITE_OTHER): Payer: Self-pay | Admitting: Surgery

## 2017-09-07 ENCOUNTER — Ambulatory Visit (INDEPENDENT_AMBULATORY_CARE_PROVIDER_SITE_OTHER): Payer: Medicare Other | Admitting: Surgery

## 2017-09-07 VITALS — BP 126/72 | HR 91 | Ht 65.0 in | Wt 286.0 lb

## 2017-09-07 DIAGNOSIS — M1711 Unilateral primary osteoarthritis, right knee: Secondary | ICD-10-CM | POA: Diagnosis not present

## 2017-09-07 DIAGNOSIS — M1712 Unilateral primary osteoarthritis, left knee: Secondary | ICD-10-CM

## 2017-09-07 NOTE — Progress Notes (Signed)
67 year old black female with history of bilateral knee DJD comes in for injections #2 of 3.  No adverse reaction to previous injections.  Exam Gait antalgic.  Bilateral knees positive crepitus.  Joint line tender.  Some swelling without large effusion.  Bilateral calves nontender.   Plan After patient consent bilateral knee Orthovisc injections #2 of 3 performed.  We will follow-up with me in 1 week for final injections

## 2017-09-08 ENCOUNTER — Ambulatory Visit (INDEPENDENT_AMBULATORY_CARE_PROVIDER_SITE_OTHER): Payer: Medicare Other | Admitting: Specialist

## 2017-09-14 ENCOUNTER — Encounter (INDEPENDENT_AMBULATORY_CARE_PROVIDER_SITE_OTHER): Payer: Self-pay | Admitting: Surgery

## 2017-09-14 ENCOUNTER — Ambulatory Visit (INDEPENDENT_AMBULATORY_CARE_PROVIDER_SITE_OTHER): Payer: Medicare Other | Admitting: Surgery

## 2017-09-14 DIAGNOSIS — M1712 Unilateral primary osteoarthritis, left knee: Secondary | ICD-10-CM | POA: Diagnosis not present

## 2017-09-14 DIAGNOSIS — M1711 Unilateral primary osteoarthritis, right knee: Secondary | ICD-10-CM

## 2017-09-14 MED ORDER — LIDOCAINE HCL 1 % IJ SOLN
2.0000 mL | INTRAMUSCULAR | Status: AC | PRN
  Administered 2017-09-14: 2 mL

## 2017-09-14 MED ORDER — HYALURONAN 30 MG/2ML IX SOSY
30.0000 mg | PREFILLED_SYRINGE | INTRA_ARTICULAR | Status: AC | PRN
  Administered 2017-09-14: 30 mg via INTRA_ARTICULAR

## 2017-09-14 NOTE — Progress Notes (Signed)
   Office Visit Note   Patient: [name]           Date of Birth: July 26, 1950           MRN: [id number] Visit Date: 09/14/2017              Requested by: Galen Manila, [place] [address] PCP: Galen Manila, MD   Assessment & Plan: Visit Diagnoses:  1. Unilateral primary osteoarthritis, left knee   2. Unilateral primary osteoarthritis, right knee     Plan: Schedule patient sent third and final bilateral knee Orthovisc injections performed.  Follow-up with Dr. Louanne Skye in 2 months to check her response.  Again we discussed definitive treatment with total knee replacements.  Patient does have an appointment scheduled with Dr. Sharol Given next week for second opinion regarding her left foot problem.  I advised her to get at least the last 5 or 6 office notes from her orthopedic physician in [place] for Dr. Sharol Given to review.  All questions answered.  Follow-Up Instructions: Return in about 2 months (around 11/14/2017) for With Dr. Louanne Skye recheck of knees.   Orders:  No orders of the defined types were placed in this encounter.  No orders of the defined types were placed in this encounter.     Procedures: Large Joint Inj: bilateral knee on 09/14/2017 9:33 AM Indications: pain Details: 25 G 1.5 in needle, medial approach Medications (Right): 2 mL lidocaine 1 %; 30 mg Hyaluronan 30 MG/2ML Medications (Left): 2 mL lidocaine 1 %; 30 mg Hyaluronan 30 MG/2ML Outcome: tolerated well, no immediate complications Procedure, treatment alternatives, risks and benefits explained, specific risks discussed. Consent was given by the patient.       Clinical Data: No additional findings.   Subjective: Chief Complaint  Patient presents with  . Right Knee - Follow-up  . Left Knee - Follow-up    HPI Patient with history of bilateral knee end-stage DJD comes in for third and final Orthovisc injections.  States that she has not noticed any improvement as of yet.   We have previously also discussed ongoing issues with her left foot that a doctor in [place] is planning on doing a reconstructive surgery of some type.  She did make an appointment with Dr. Meridee Score in our clinic next week for an opinion.  States that she did get imaging studies but I request their office notes. Review of Systems No current cardiac pulmonary GI GU issues  Objective: Vital Signs: There were no vitals taken for this visit.  Physical Exam Gait antalgic.  Bilateral knees joint nontender.  Some swelling.  Positive crepitus. Ortho Exam  Specialty Comments:  No specialty comments available.  Imaging: No results found.   PMFS History: There are no active problems to display for this patient.  History reviewed. No pertinent past medical history.  History reviewed. No pertinent family history.  History reviewed. No pertinent surgical history. Social History   Occupational History  . Not on file  Tobacco Use  . Smoking status: Never Smoker  . Smokeless tobacco: Never Used  Substance and Sexual Activity  . Alcohol use: No  . Drug use: No  . Sexual activity: Not on file

## 2017-09-18 ENCOUNTER — Ambulatory Visit (INDEPENDENT_AMBULATORY_CARE_PROVIDER_SITE_OTHER): Payer: Medicare Other | Admitting: Orthopedic Surgery

## 2017-09-18 ENCOUNTER — Encounter (INDEPENDENT_AMBULATORY_CARE_PROVIDER_SITE_OTHER): Payer: Self-pay | Admitting: Orthopedic Surgery

## 2017-09-18 VITALS — Ht 65.0 in | Wt 280.0 lb

## 2017-09-18 DIAGNOSIS — M76822 Posterior tibial tendinitis, left leg: Secondary | ICD-10-CM | POA: Diagnosis not present

## 2017-09-18 DIAGNOSIS — M216X2 Other acquired deformities of left foot: Secondary | ICD-10-CM

## 2017-09-18 DIAGNOSIS — Z6841 Body Mass Index (BMI) 40.0 and over, adult: Secondary | ICD-10-CM

## 2017-09-18 DIAGNOSIS — E43 Unspecified severe protein-calorie malnutrition: Secondary | ICD-10-CM

## 2017-10-02 ENCOUNTER — Encounter (INDEPENDENT_AMBULATORY_CARE_PROVIDER_SITE_OTHER): Payer: Self-pay | Admitting: Orthopedic Surgery

## 2017-10-02 DIAGNOSIS — M216X2 Other acquired deformities of left foot: Secondary | ICD-10-CM | POA: Insufficient documentation

## 2017-10-02 DIAGNOSIS — M76822 Posterior tibial tendinitis, left leg: Secondary | ICD-10-CM | POA: Insufficient documentation

## 2017-10-02 NOTE — Progress Notes (Signed)
Office Visit Note   Patient: [name]           Date of Birth: 1950-04-24           MRN: [id number] Visit Date: 09/18/2017              Requested by: Galen Manila, [place] [facility] [place] [address] PCP: Galen Manila, MD  Chief Complaint  Patient presents with  . Left Foot - Pain  . Left Ankle - Pain      HPI: Patient is a 67 year old woman who complains of left foot pain for over 5 months.  She states that she was initially evaluated with diagnosis of posterior tibial tendon sprain.  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  She complains of pain and swelling.  Patient states her primary care physician recommended surgical evaluation.  Past medical history negative for tobacco.  Patient is a type II diabetic she states that her glucose is well controlled.  Assessment & Plan: Visit Diagnoses:  1. Planovalgus deformity of foot, acquired, left   2. Posterior tibial tendinitis, left leg   3. Body mass index 45.0-49.9, adult ([place])   4. Morbid obesity ([place])   5. Severe protein-calorie malnutrition ([place])     Plan: Due to failure conservative care patient states she would like to proceed with surgical intervention.  Would plan for subtalar and talonavicular fusion.  Risks and benefits of surgery were discussed including nonunion persistent pain need for additional surgery.  We will call to set this up.  Follow-Up Instructions: Return in about 1 week (around 09/25/2017).   Ortho Exam  Patient is alert, oriented, no adenopathy, well-dressed, normal affect, normal respiratory effort. On examination patient has an antalgic gait.  She has a positive too many toes sign with all the toes laterally she has significant valgus pronation and pace planus on the left.  She has a good pulse.  Radiographs shows calcification within the soft tissue no signs of bony infection.  Patient has a  good dorsalis pedis pulse she cannot do a single limb heel raise.  She is tender to palpation along the posterior tibial tendon as well as the sinus Tarsi.  Patient has an albumin of 2.3 with protein caloric malnutrition.  Imaging: No results found. No images are attached to the encounter.  Labs: Lab Results  Component Value Date   HGBA1C  04/30/2008    6.0 (NOTE)   The ADA recommends the following therapeutic goal for glycemic   control related to Hgb A1C measurement:   Goal of Therapy:   < 7.0% Hgb A1C   Reference: American Diabetes Association: Clinical Practice   Recommendations 2008, Diabetes Care,  2008, 31:(Suppl 1).   REPTSTATUS 05/04/2008 FINAL 05/03/2008   GRAMSTAIN  04/29/2008    ABUNDANT WBC PRESENT,BOTH PMN AND MONONUCLEAR NO ORGANISMS SEEN Gram Stain Report Called to,Read Back By and Verified With: Gram Stain Report Called to,Read Back By and Verified With: JOSH RN [id number] ON 04/29/08 BY WILSONM Performed at [facility]  04/29/2008    ABUNDANT WBC PRESENT,BOTH PMN AND MONONUCLEAR NO ORGANISMS SEEN Gram Stain Report Called to,Read Back By and Verified With: Gram Stain Report Called to,Read Back By and Verified With: JOSH RN [id number] ON 04/29/08 Performed at [facility]  04/29/2008    ABUNDANT WBC PRESENT,BOTH PMN AND  MONONUCLEAR NO ORGANISMS SEEN Gram Stain Report Called to,Read Back By and Verified With: Josh RN 11:05 04/29/08 ([email or web address])   CULT YEAST 05/03/2008     Lab Results  Component Value Date   ALBUMIN 2.3 (L) 05/22/2008   ALBUMIN 2.3 (L) 05/21/2008   ALBUMIN 2.3 (L) 05/19/2008   PREALBUMIN 13.1 (L) 05/18/2008    Body mass index is 46.59 kg/m.  Orders:  No orders of the defined types were placed in this encounter.  No orders of the defined types were placed in this encounter.    Procedures: No procedures performed  Clinical Data: No additional findings.  ROS:  All other systems negative, except as noted in the  HPI. Review of Systems  Objective: Vital Signs: Ht 5\' 5"  (1.651 m)   Wt 280 lb (127 kg)   BMI 46.59 kg/m   Specialty Comments:  No specialty comments available.  PMFS History: Patient Active Problem List   Diagnosis Date Noted  . Posterior tibial tendinitis, left leg 10/02/2017  . Planovalgus deformity of foot, acquired, left 10/02/2017   History reviewed. No pertinent past medical history.  History reviewed. No pertinent family history.  History reviewed. No pertinent surgical history. Social History   Occupational History  . Not on file  Tobacco Use  . Smoking status: Never Smoker  . Smokeless tobacco: Never Used  Substance and Sexual Activity  . Alcohol use: No  . Drug use: No  . Sexual activity: Not on file

## 2017-10-10 ENCOUNTER — Ambulatory Visit (INDEPENDENT_AMBULATORY_CARE_PROVIDER_SITE_OTHER): Payer: Self-pay | Admitting: Physician Assistant

## 2017-10-16 ENCOUNTER — Other Ambulatory Visit (INDEPENDENT_AMBULATORY_CARE_PROVIDER_SITE_OTHER): Payer: Self-pay | Admitting: Specialist

## 2017-10-16 DIAGNOSIS — M778 Other enthesopathies, not elsewhere classified: Secondary | ICD-10-CM

## 2017-10-16 DIAGNOSIS — G5603 Carpal tunnel syndrome, bilateral upper limbs: Secondary | ICD-10-CM

## 2017-10-16 DIAGNOSIS — M7062 Trochanteric bursitis, left hip: Secondary | ICD-10-CM

## 2017-10-16 DIAGNOSIS — M7061 Trochanteric bursitis, right hip: Secondary | ICD-10-CM

## 2017-10-16 DIAGNOSIS — M174 Other bilateral secondary osteoarthritis of knee: Secondary | ICD-10-CM

## 2017-10-16 DIAGNOSIS — M7582 Other shoulder lesions, left shoulder: Secondary | ICD-10-CM

## 2017-10-16 MED ORDER — TRAMADOL HCL 50 MG PO TABS: 100.0000 mg | ORAL_TABLET | Freq: Four times a day (QID) | ORAL | 0 refills | Status: DC | PRN

## 2017-10-16 NOTE — Telephone Encounter (Signed)
Patient requesting rx refill on tramadol, [facility] [place] [phone]

## 2017-10-16 NOTE — Telephone Encounter (Signed)
Sent request to Dr. Nitka 

## 2017-10-17 NOTE — Telephone Encounter (Signed)
[facility]

## 2017-10-23 NOTE — Pre-Procedure Instructions (Signed)
[name]  10/23/2017      [id number] - [place], [place] - [place] [address] [place] [address] Phone: [phone] Fax: [phone]    Your procedure is scheduled on Wednesday September 25.  Report to [facility] Admitting at 7:30 A.M.  Call this number if you have problems the morning of surgery:  [phone]   Remember:  Do not eat or drink after midnight.    Take these medicines the morning of surgery with A SIP OF WATER:   Anastrozole (arimidex) Esomeprazole (Nexium) Pregabalin (Lyrica) Immodium if needed Tramadol (ultram) if needed  DO NOT TAKE Metformin (glucophage) the day of surgery DO NOT TAKE Sitagliptin-Metformin the day of surgery  7 days prior to surgery STOP taking any Aspirin(unless otherwise instructed by your surgeon), Aleve, Naproxen, Ibuprofen, Motrin, Advil, Goody's, BC's, all herbal medications, fish oil, and all vitamins     How to Manage Your Diabetes Before and After Surgery  Why is it important to control my blood sugar before and after surgery? . Improving blood sugar levels before and after surgery helps healing and can limit problems. . A way of improving blood sugar control is eating a healthy diet by: o  Eating less sugar and carbohydrates o  Increasing activity/exercise o  Talking with your doctor about reaching your blood sugar goals . High blood sugars (greater than 180 mg/dL) can raise your risk of infections and slow your recovery, so you will need to focus on controlling your diabetes during the weeks before surgery. . Make sure that the doctor who takes care of your diabetes knows about your planned surgery including the date and location.  How do I manage my blood sugar before surgery? . Check your blood sugar at least 4 times a day, starting 2 days before surgery, to make sure that the level is not too high or low. o Check your blood sugar the  morning of your surgery when you wake up and every 2 hours until you get to the Short Stay unit. . If your blood sugar is less than 70 mg/dL, you will need to treat for low blood sugar: o Do not take insulin. o Treat a low blood sugar (less than 70 mg/dL) with  cup of clear juice (cranberry or apple), 4 glucose tablets, OR glucose gel. Recheck blood sugar in 15 minutes after treatment (to make sure it is greater than 70 mg/dL). If your blood sugar is not greater than 70 mg/dL on recheck, call [phone] o  for further instructions. . Report your blood sugar to the short stay nurse when you get to Short Stay.  . If you are admitted to the hospital after surgery: o Your blood sugar will be checked by the staff and you will probably be given insulin after surgery (instead of oral diabetes medicines) to make sure you have good blood sugar levels. o The goal for blood sugar control after surgery is 80-180 mg/dL.                 Do not wear jewelry, make-up or nail polish.  Do not wear lotions, powders, or perfumes, or deodorant.  Do not shave 48 hours prior to surgery.  Men may shave face and neck.  Do not bring valuables to the hospital.  [facility] is not responsible for any belongings or valuables.  Contacts, dentures or bridgework may not be worn into surgery.  Leave your suitcase in the car.  After surgery it may be brought to your room.  For patients admitted to the hospital, discharge time will be determined by your treatment team.  Patients discharged the day of surgery will not be allowed to drive home.   Special instructions:    [place]- Preparing For Surgery  Before surgery, you can play an important role. Because skin is not sterile, your skin needs to be as free of germs as possible. You can reduce the number of germs on your skin by washing with CHG (chlorahexidine gluconate) Soap before surgery.  CHG is an antiseptic cleaner which kills germs and bonds with  the skin to continue killing germs even after washing.    Oral Hygiene is also important to reduce your risk of infection.  Remember - BRUSH YOUR TEETH THE MORNING OF SURGERY WITH YOUR REGULAR TOOTHPASTE  Please do not use if you have an allergy to CHG or antibacterial soaps. If your skin becomes reddened/irritated stop using the CHG.  Do not shave (including legs and underarms) for at least 48 hours prior to first CHG shower. It is OK to shave your face.  Please follow these instructions carefully.   1. Shower the NIGHT BEFORE SURGERY and the MORNING OF SURGERY with CHG.   2. If you chose to wash your hair, wash your hair first as usual with your normal shampoo.  3. After you shampoo, rinse your hair and body thoroughly to remove the shampoo.  4. Use CHG as you would any other liquid soap. You can apply CHG directly to the skin and wash gently with a scrungie or a clean washcloth.   5. Apply the CHG Soap to your body ONLY FROM THE NECK DOWN.  Do not use on open wounds or open sores. Avoid contact with your eyes, ears, mouth and genitals (private parts). Wash Face and genitals (private parts)  with your normal soap.  6. Wash thoroughly, paying special attention to the area where your surgery will be performed.  7. Thoroughly rinse your body with warm water from the neck down.  8. DO NOT shower/wash with your normal soap after using and rinsing off the CHG Soap.  9. Pat yourself dry with a CLEAN TOWEL.  10. Wear CLEAN PAJAMAS to bed the night before surgery, wear comfortable clothes the morning of surgery  11. Place CLEAN SHEETS on your bed the night of your first shower and DO NOT SLEEP WITH PETS.    Day of Surgery:  Do not apply any deodorants/lotions.  Please wear clean clothes to the hospital/surgery center.   Remember to brush your teeth WITH YOUR REGULAR TOOTHPASTE.    Please read over the following fact sheets that you were given. Coughing and Deep Breathing, MRSA  Information and Surgical Site Infection Prevention

## 2017-10-24 ENCOUNTER — Other Ambulatory Visit: Payer: Self-pay

## 2017-10-24 ENCOUNTER — Encounter (HOSPITAL_COMMUNITY)
Admission: RE | Admit: 2017-10-24 | Discharge: 2017-10-24 | Disposition: A | Discharge status: Home / Self Care | Payer: Medicare Other | Source: Ambulatory Visit | Attending: Orthopedic Surgery | Admitting: Orthopedic Surgery

## 2017-10-24 ENCOUNTER — Encounter (HOSPITAL_COMMUNITY): Payer: Self-pay

## 2017-10-24 DIAGNOSIS — I2699 Other pulmonary embolism without acute cor pulmonale: Secondary | ICD-10-CM | POA: Diagnosis not present

## 2017-10-24 DIAGNOSIS — I10 Essential (primary) hypertension: Secondary | ICD-10-CM | POA: Insufficient documentation

## 2017-10-24 DIAGNOSIS — Z7984 Long term (current) use of oral hypoglycemic drugs: Secondary | ICD-10-CM | POA: Insufficient documentation

## 2017-10-24 DIAGNOSIS — Z01818 Encounter for other preprocedural examination: Secondary | ICD-10-CM | POA: Diagnosis not present

## 2017-10-24 DIAGNOSIS — Z7901 Long term (current) use of anticoagulants: Secondary | ICD-10-CM | POA: Insufficient documentation

## 2017-10-24 DIAGNOSIS — Z79899 Other long term (current) drug therapy: Secondary | ICD-10-CM | POA: Insufficient documentation

## 2017-10-24 DIAGNOSIS — K219 Gastro-esophageal reflux disease without esophagitis: Secondary | ICD-10-CM | POA: Diagnosis not present

## 2017-10-24 DIAGNOSIS — E119 Type 2 diabetes mellitus without complications: Secondary | ICD-10-CM | POA: Diagnosis not present

## 2017-10-24 DIAGNOSIS — M76822 Posterior tibial tendinitis, left leg: Secondary | ICD-10-CM | POA: Diagnosis not present

## 2017-10-24 HISTORY — DX: Unspecified intestinal obstruction, unspecified as to partial versus complete obstruction: K56.609

## 2017-10-24 HISTORY — DX: Malignant (primary) neoplasm, unspecified: C80.1

## 2017-10-24 HISTORY — DX: Gastro-esophageal reflux disease without esophagitis: K21.9

## 2017-10-24 HISTORY — DX: Essential (primary) hypertension: I10

## 2017-10-24 HISTORY — DX: Presence of other vascular implants and grafts: Z95.828

## 2017-10-24 HISTORY — DX: Unspecified osteoarthritis, unspecified site: M19.90

## 2017-10-24 HISTORY — DX: Fibromyalgia: M79.7

## 2017-10-24 HISTORY — DX: Type 2 diabetes mellitus without complications: E11.9

## 2017-10-24 LAB — CBC
HEMATOCRIT: 38.2 % (ref 36.0–46.0)
HEMOGLOBIN: 11.8 g/dL — AB (ref 12.0–15.0)
MCH: 30.5 pg (ref 26.0–34.0)
MCHC: 30.9 g/dL (ref 30.0–36.0)
MCV: 98.7 fL (ref 78.0–100.0)
Platelets: 184 10*3/uL (ref 150–400)
RBC: 3.87 MIL/uL (ref 3.87–5.11)
RDW: 14.5 % (ref 11.5–15.5)
WBC: 7.3 10*3/uL (ref 4.0–10.5)

## 2017-10-24 LAB — BASIC METABOLIC PANEL
Anion gap: 10 (ref 5–15)
BUN: 29 mg/dL — ABNORMAL HIGH (ref 8–23)
CHLORIDE: 104 mmol/L (ref 98–111)
CO2: 27 mmol/L (ref 22–32)
Calcium: 9.6 mg/dL (ref 8.9–10.3)
Creatinine, Ser: 1.19 mg/dL — ABNORMAL HIGH (ref 0.44–1.00)
GFR, EST AFRICAN AMERICAN: 54 mL/min — AB (ref >60)
GFR, EST NON AFRICAN AMERICAN: 47 mL/min — AB (ref >60)
Glucose, Bld: 135 mg/dL — ABNORMAL HIGH (ref 70–99)
Potassium: 4.6 mmol/L (ref 3.5–5.1)
SODIUM: 141 mmol/L (ref 135–145)

## 2017-10-24 LAB — SURGICAL PCR SCREEN
MRSA, PCR: NEGATIVE
STAPHYLOCOCCUS AUREUS: NEGATIVE

## 2017-10-24 LAB — GLUCOSE, CAPILLARY: GLUCOSE-CAPILLARY: 202 mg/dL — AB (ref 70–99)

## 2017-10-24 NOTE — Progress Notes (Signed)
PCP - Dr. Geradine Girt Cardiologist -denies   Chest x-ray - N/A EKG - 10/24/17 Stress Test - 5+ years ago ECHO - 2016-CE Cardiac Cath - denies  Sleep Study - denies  CBG at PAT appointment-202 Fasting Blood Sugar - 130-150 Checks Blood Sugar every morning, sometimes 2 times a day.   Blood Thinner Instructions: Coumadin, pt received no instructions on when to stop coumadin. Called Dr. Jess Barters office and left a message with Baird Lyons to either call me back or notify the patient of instructions.   Anesthesia review: Yes  Patient denies shortness of breath, fever, cough and chest pain at PAT appointment   Patient verbalized understanding of instructions that were given to them at the PAT appointment. Patient was also instructed that they will need to review over the PAT instructions again at home before surgery.

## 2017-10-25 ENCOUNTER — Telehealth (INDEPENDENT_AMBULATORY_CARE_PROVIDER_SITE_OTHER): Payer: Self-pay | Admitting: Orthopedic Surgery

## 2017-10-25 LAB — HEMOGLOBIN A1C
Hgb A1c MFr Bld: 6.7 % — ABNORMAL HIGH (ref 4.8–5.6)
Mean Plasma Glucose: 146 mg/dL

## 2017-10-25 NOTE — Telephone Encounter (Signed)
[name] is scheduled for subtalar/talonavicular fusion with Dr. Sharol Given.  There was a question as to whether or not she should stop her Coumadin prior to surgery.  Per Dr. Sharol Given, I called [name] and advised her to continue taking her Coumadin.  Advised that she did not have to stop for this surgery.

## 2017-10-25 NOTE — Progress Notes (Signed)
Anesthesia Chart Review:  Case:  [id number] Date/Time:  11/01/17 3875   Procedure:  Gwyneth Revels AND TALONAVICULAR FUSION LEFT FOOT (Left )   Anesthesia type:  General   Pre-op diagnosis:  Posterior Tibial Tendon Insufficiency Left Foot   Location:  [facility] OR ROOM 03 / [facility]   Surgeon:  Newt Minion, MD      DISCUSSION: 67 yo female never smoker for above procedure. 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  Per telephone encounter 10/25/2017 by Baird Lyons Dr. Sharol Given has advised the pt to continue on coumadin for the procedure.  Anticipate she can proceed with surgery as planned barring acute status change.  VS: BP (!) 130/55   Pulse 96   Temp 36.9 C   Resp 20   Ht 5\' 5"  (1.651 m)   Wt 133.5 kg   SpO2 97%   BMI 48.97 kg/m   PROVIDERS: Galen Manila, MD is PCP   LABS: Labs reviewed: Acceptable for surgery. (all labs ordered are listed, but only abnormal results are displayed)  Labs Reviewed  GLUCOSE, CAPILLARY - Abnormal; Notable for the following components:      Result Value   Glucose-Capillary 202 (*)    All other components within normal limits  HEMOGLOBIN A1C - Abnormal; Notable for the following components:   Hgb A1c MFr Bld 6.7 (*)    All other components within normal limits  BASIC METABOLIC PANEL - Abnormal; Notable for the following components:   Glucose, Bld 135 (*)    BUN 29 (*)    Creatinine, Ser 1.19 (*)    GFR calc non Af Amer 47 (*)    GFR calc Af Amer 54 (*)    All other components within normal limits  CBC - Abnormal; Notable for the following components:   Hemoglobin 11.8 (*)    All other components within normal limits  SURGICAL PCR SCREEN     IMAGES: N/A   EKG: 10/24/17: NSR  CV: TTE 12/24/2014: Summary  1. Overall left ventricular ejection fraction is estimated at 65 to 70%.  2. Normal global left ventricular systolic function.  Left Ventricle: Normal left ventricular size and wall  thicknesses, with normal systolic and diastolic function. Overall left ventricular ejection fraction is estimated at 65 to 70%. The left ventricular internal cavity size was normal. LV septal wall thickness was normal. LV posterior wall thickness is normal. Global LV systolic function was normal. Tissue Doppler indicates an equivocal left ventricular filling pressure.  LV Wall Scoring: All segments are normal.   Right Ventricle: Normal right ventricular size, wall thickness, and systolic function.  Left Atrium: The left atrium is normal in size.  Right Atrium: The right atrium is normal in size. The inferior vena cava measures 1.97 cm.  Pericardium: There is no evidence of pericardial effusion.  Mitral Valve: Structurally normal mitral valve, with normal leaflet excursion; without any evidence of mitral stenosis. No evidence of mitral valve regurgitation is seen.  Tricuspid Valve: The tricuspid valve is normal in structure. No tricuspid regurgitation is visualized. The tricuspid regurgitant velocity is 2.52 m/s, and with an assumed right atrial pressure of 10 mmHg, the estimated right ventricular systolic pressure is normal at 35.5 mmHg.  Aortic Valve: The aortic valve appears trileaflet. No degree of aortic stenosis is present. There is mild aortic valve sclerosis. The peak pressure gradient across the aortic valve is 10.8 mmHg, and the mean pressure gradient is 5.0 mmHg.  The calculated AVA is 2.53 cm. No evidence of aortic valve regurgitation is seen.  Pulmonic Valve: The pulmonic valve is normal. No indication of pulmonic valve regurgitation.  Aorta: The aortic root and ascending aorta appear structurally normal, with no evidence of dilitation.  Shunts: No evidence of intracardiac shunt.  Past Medical History:  Diagnosis Date  . Arthritis   . Bowel obstruction ([place])   . Cancer ([place]) 2015   Breast-right  . Diabetes mellitus without complication ([place])   . Fibromyalgia    . GERD (gastroesophageal reflux disease)   . Hypertension   . Presence of IVC filter 2010    Past Surgical History:  Procedure Laterality Date  . ABDOMINAL HYSTERECTOMY    . BACK SURGERY  2007, 2008, 2010   x3    MEDICATIONS: . acetaminophen (TYLENOL) 650 MG CR tablet  . anastrozole (ARIMIDEX) 1 MG tablet  . esomeprazole (NEXIUM) 40 MG capsule  . loperamide (IMODIUM A-D) 2 MG tablet  . losartan-hydrochlorothiazide (HYZAAR) 100-12.5 MG tablet  . MAGNESIUM CITRATE PO  . metFORMIN (GLUCOPHAGE) 1000 MG tablet  . potassium chloride (K-DUR,KLOR-CON) 10 MEQ tablet  . pregabalin (LYRICA) 75 MG capsule  . SitaGLIPtin-MetFORMIN HCl 50-1000 MG TB24  . torsemide (DEMADEX) 10 MG tablet  . traMADol (ULTRAM) 50 MG tablet  . vitamin B-12 (CYANOCOBALAMIN) 1000 MCG tablet  . Vitamin D, Ergocalciferol, (DRISDOL) 50000 units CAPS capsule  . warfarin (COUMADIN) 5 MG tablet   No current facility-administered medications for this encounter.     Wynonia Musty [facility] Short Stay Center/Anesthesiology Phone [phone] 10/25/2017 10:48 AM

## 2017-10-31 MED ORDER — DEXTROSE 5 % IV SOLN
3.0000 g | INTRAVENOUS | Status: AC
  Administered 2017-11-01: 3 g via INTRAVENOUS
  Filled 2017-10-31: qty 3

## 2017-10-31 NOTE — Anesthesia Preprocedure Evaluation (Addendum)
Anesthesia Evaluation  Patient identified by MRN, date of birth, ID band Patient awake    Reviewed: Allergy & Precautions, H&P , NPO status , Patient's Chart, lab work & pertinent test results  Airway Mallampati: II  TM Distance: >3 FB Neck ROM: Full    Dental no notable dental hx. (+) Upper Dentures, Dental Advisory Given   Pulmonary neg pulmonary ROS,    Pulmonary exam normal breath sounds clear to auscultation       Cardiovascular Exercise Tolerance: Good hypertension, Pt. on medications  Rhythm:Regular Rate:Normal     Neuro/Psych negative neurological ROS  negative psych ROS   GI/Hepatic Neg liver ROS, GERD  Medicated and Controlled,  Endo/Other  diabetes, Type 2, Oral Hypoglycemic AgentsMorbid obesity  Renal/GU negative Renal ROS  negative genitourinary   Musculoskeletal  (+) Arthritis , Fibromyalgia -  Abdominal   Peds  Hematology negative hematology ROS (+)   Anesthesia Other Findings   Reproductive/Obstetrics negative OB ROS                           Anesthesia Physical Anesthesia Plan  ASA: III  Anesthesia Plan: General   Post-op Pain Management:  Regional for Post-op pain   Induction: Intravenous  PONV Risk Score and Plan: 4 or greater and Ondansetron, Dexamethasone and Midazolam  Airway Management Planned: Oral ETT and LMA  Additional Equipment:   Intra-op Plan:   Post-operative Plan: Extubation in OR  Informed Consent: I have reviewed the patients History and Physical, chart, labs and discussed the procedure including the risks, benefits and alternatives for the proposed anesthesia with the patient or authorized representative who has indicated his/her understanding and acceptance.   Dental advisory given  Plan Discussed with: CRNA  Anesthesia Plan Comments:        Anesthesia Quick Evaluation

## 2017-11-01 ENCOUNTER — Other Ambulatory Visit: Payer: Self-pay

## 2017-11-01 ENCOUNTER — Inpatient Hospital Stay (HOSPITAL_COMMUNITY): Payer: Medicare Other | Admitting: Anesthesiology

## 2017-11-01 ENCOUNTER — Encounter (HOSPITAL_COMMUNITY)
Admission: RE | Disposition: A | Discharge status: Home / Self Care | Payer: Self-pay | Source: Ambulatory Visit | Attending: Orthopedic Surgery

## 2017-11-01 ENCOUNTER — Encounter (HOSPITAL_COMMUNITY): Payer: Self-pay | Admitting: Urology

## 2017-11-01 ENCOUNTER — Inpatient Hospital Stay (HOSPITAL_COMMUNITY): Payer: Medicare Other | Admitting: Physician Assistant

## 2017-11-01 ENCOUNTER — Observation Stay (HOSPITAL_COMMUNITY)
Admission: RE | Admit: 2017-11-01 | Discharge: 2017-11-02 | Disposition: A | Discharge status: Home / Self Care | Payer: Medicare Other | Source: Ambulatory Visit | Attending: Orthopedic Surgery | Admitting: Orthopedic Surgery

## 2017-11-01 DIAGNOSIS — M797 Fibromyalgia: Secondary | ICD-10-CM | POA: Diagnosis not present

## 2017-11-01 DIAGNOSIS — E119 Type 2 diabetes mellitus without complications: Secondary | ICD-10-CM | POA: Insufficient documentation

## 2017-11-01 DIAGNOSIS — Z79899 Other long term (current) drug therapy: Secondary | ICD-10-CM | POA: Insufficient documentation

## 2017-11-01 DIAGNOSIS — I10 Essential (primary) hypertension: Secondary | ICD-10-CM | POA: Insufficient documentation

## 2017-11-01 DIAGNOSIS — Z888 Allergy status to other drugs, medicaments and biological substances status: Secondary | ICD-10-CM | POA: Insufficient documentation

## 2017-11-01 DIAGNOSIS — Z79811 Long term (current) use of aromatase inhibitors: Secondary | ICD-10-CM | POA: Diagnosis not present

## 2017-11-01 DIAGNOSIS — K219 Gastro-esophageal reflux disease without esophagitis: Secondary | ICD-10-CM | POA: Insufficient documentation

## 2017-11-01 DIAGNOSIS — Z79891 Long term (current) use of opiate analgesic: Secondary | ICD-10-CM | POA: Diagnosis not present

## 2017-11-01 DIAGNOSIS — Z7984 Long term (current) use of oral hypoglycemic drugs: Secondary | ICD-10-CM | POA: Diagnosis not present

## 2017-11-01 DIAGNOSIS — Z6841 Body Mass Index (BMI) 40.0 and over, adult: Secondary | ICD-10-CM | POA: Diagnosis not present

## 2017-11-01 DIAGNOSIS — Z7901 Long term (current) use of anticoagulants: Secondary | ICD-10-CM | POA: Diagnosis not present

## 2017-11-01 DIAGNOSIS — E43 Unspecified severe protein-calorie malnutrition: Secondary | ICD-10-CM | POA: Diagnosis not present

## 2017-11-01 DIAGNOSIS — M76822 Posterior tibial tendinitis, left leg: Principal | ICD-10-CM | POA: Diagnosis present

## 2017-11-01 DIAGNOSIS — M216X2 Other acquired deformities of left foot: Secondary | ICD-10-CM | POA: Insufficient documentation

## 2017-11-01 DIAGNOSIS — Z853 Personal history of malignant neoplasm of breast: Secondary | ICD-10-CM | POA: Insufficient documentation

## 2017-11-01 HISTORY — PX: ANKLE FUSION: SHX5718

## 2017-11-01 LAB — GLUCOSE, CAPILLARY
GLUCOSE-CAPILLARY: 141 mg/dL — AB (ref 70–99)
GLUCOSE-CAPILLARY: 165 mg/dL — AB (ref 70–99)
GLUCOSE-CAPILLARY: 245 mg/dL — AB (ref 70–99)
Glucose-Capillary: 137 mg/dL — ABNORMAL HIGH (ref 70–99)

## 2017-11-01 LAB — PROTIME-INR
INR: 1.64
PROTHROMBIN TIME: 19.3 s — AB (ref 11.4–15.2)

## 2017-11-01 SURGERY — ANKLE FUSION
Anesthesia: General | Laterality: Left

## 2017-11-01 MED ORDER — LACTATED RINGERS IV SOLN
INTRAVENOUS | Status: DC
  Administered 2017-11-01 (×2): via INTRAVENOUS

## 2017-11-01 MED ORDER — FENTANYL CITRATE (PF) 100 MCG/2ML IJ SOLN
INTRAMUSCULAR | Status: DC | PRN
  Administered 2017-11-01 (×2): 50 ug via INTRAVENOUS

## 2017-11-01 MED ORDER — POTASSIUM CHLORIDE CRYS ER 10 MEQ PO TBCR: 10.0000 meq | EXTENDED_RELEASE_TABLET | Freq: Every day | ORAL | Status: DC | PRN

## 2017-11-01 MED ORDER — OXYCODONE HCL 5 MG PO TABS
10.0000 mg | ORAL_TABLET | ORAL | Status: DC | PRN
  Administered 2017-11-02: 10 mg via ORAL

## 2017-11-01 MED ORDER — PROPOFOL 10 MG/ML IV BOLUS
INTRAVENOUS | Status: AC
  Filled 2017-11-01: qty 20

## 2017-11-01 MED ORDER — METFORMIN HCL 500 MG PO TABS
1000.0000 mg | ORAL_TABLET | Freq: Every day | ORAL | Status: DC
  Administered 2017-11-02: 1000 mg via ORAL
  Filled 2017-11-01: qty 2

## 2017-11-01 MED ORDER — HYDROMORPHONE HCL 1 MG/ML IJ SOLN: 0.5000 mg | INTRAMUSCULAR | Status: DC | PRN

## 2017-11-01 MED ORDER — DEXAMETHASONE SODIUM PHOSPHATE 10 MG/ML IJ SOLN
INTRAMUSCULAR | Status: DC | PRN
  Administered 2017-11-01: 10 mg via INTRAVENOUS

## 2017-11-01 MED ORDER — POLYETHYLENE GLYCOL 3350 17 G PO PACK: 17.0000 g | PACK | Freq: Every day | ORAL | Status: DC | PRN

## 2017-11-01 MED ORDER — LOSARTAN POTASSIUM-HCTZ 100-12.5 MG PO TABS: 1.0000 | ORAL_TABLET | Freq: Every day | ORAL | Status: DC

## 2017-11-01 MED ORDER — METHOCARBAMOL 500 MG PO TABS: 500.0000 mg | ORAL_TABLET | Freq: Four times a day (QID) | ORAL | Status: DC | PRN

## 2017-11-01 MED ORDER — SODIUM CHLORIDE 0.9 % IV SOLN
INTRAVENOUS | Status: DC
  Administered 2017-11-01: 15:00:00 via INTRAVENOUS

## 2017-11-01 MED ORDER — HYDROCHLOROTHIAZIDE 12.5 MG PO CAPS
12.5000 mg | ORAL_CAPSULE | Freq: Every day | ORAL | Status: DC
  Administered 2017-11-01 – 2017-11-02 (×2): 12.5 mg via ORAL
  Filled 2017-11-01 (×2): qty 1

## 2017-11-01 MED ORDER — DOCUSATE SODIUM 100 MG PO CAPS
100.0000 mg | ORAL_CAPSULE | Freq: Two times a day (BID) | ORAL | Status: DC
  Administered 2017-11-01 – 2017-11-02 (×2): 100 mg via ORAL
  Filled 2017-11-01 (×2): qty 1

## 2017-11-01 MED ORDER — BUPIVACAINE HCL (PF) 0.5 % IJ SOLN
INTRAMUSCULAR | Status: DC | PRN
  Administered 2017-11-01: 15 mL

## 2017-11-01 MED ORDER — PROPOFOL 10 MG/ML IV BOLUS
INTRAVENOUS | Status: DC | PRN
  Administered 2017-11-01: 150 mg via INTRAVENOUS
  Administered 2017-11-01: 50 mg via INTRAVENOUS

## 2017-11-01 MED ORDER — WARFARIN - PHARMACIST DOSING INPATIENT: Freq: Every day | Status: DC

## 2017-11-01 MED ORDER — LINAGLIPTIN 5 MG PO TABS
5.0000 mg | ORAL_TABLET | Freq: Every day | ORAL | Status: DC
  Administered 2017-11-02: 5 mg via ORAL
  Filled 2017-11-01: qty 1

## 2017-11-01 MED ORDER — PHENYLEPHRINE HCL 10 MG/ML IJ SOLN
INTRAMUSCULAR | Status: DC | PRN
  Administered 2017-11-01 (×2): 200 ug via INTRAVENOUS
  Administered 2017-11-01: 160 ug via INTRAVENOUS

## 2017-11-01 MED ORDER — METOCLOPRAMIDE HCL 5 MG PO TABS: 5.0000 mg | ORAL_TABLET | Freq: Three times a day (TID) | ORAL | Status: DC | PRN

## 2017-11-01 MED ORDER — SUCCINYLCHOLINE CHLORIDE 20 MG/ML IJ SOLN
INTRAMUSCULAR | Status: DC | PRN
  Administered 2017-11-01: 100 mg via INTRAVENOUS

## 2017-11-01 MED ORDER — KETOROLAC TROMETHAMINE 15 MG/ML IJ SOLN
7.5000 mg | Freq: Four times a day (QID) | INTRAMUSCULAR | Status: AC
  Administered 2017-11-01 – 2017-11-02 (×4): 7.5 mg via INTRAVENOUS
  Filled 2017-11-01 (×5): qty 1

## 2017-11-01 MED ORDER — LOSARTAN POTASSIUM 50 MG PO TABS
100.0000 mg | ORAL_TABLET | Freq: Every day | ORAL | Status: DC
  Administered 2017-11-01 – 2017-11-02 (×2): 100 mg via ORAL
  Filled 2017-11-01 (×2): qty 2

## 2017-11-01 MED ORDER — HYDROMORPHONE HCL 1 MG/ML IJ SOLN: 0.2500 mg | INTRAMUSCULAR | Status: DC | PRN

## 2017-11-01 MED ORDER — SITAGLIP PHOS-METFORMIN HCL ER 50-1000 MG PO TB24: 1.0000 | ORAL_TABLET | ORAL | Status: DC

## 2017-11-01 MED ORDER — SODIUM CHLORIDE 0.9 % IV SOLN
INTRAVENOUS | Status: DC | PRN
  Administered 2017-11-01: 50 ug/min via INTRAVENOUS

## 2017-11-01 MED ORDER — CHLORHEXIDINE GLUCONATE 4 % EX LIQD: 60.0000 mL | Freq: Once | CUTANEOUS | Status: DC

## 2017-11-01 MED ORDER — ONDANSETRON HCL 4 MG/2ML IJ SOLN
INTRAMUSCULAR | Status: DC | PRN
  Administered 2017-11-01: 4 mg via INTRAVENOUS

## 2017-11-01 MED ORDER — METFORMIN HCL 500 MG PO TABS
1000.0000 mg | ORAL_TABLET | Freq: Every day | ORAL | Status: DC
  Administered 2017-11-01: 1000 mg via ORAL
  Filled 2017-11-01: qty 2

## 2017-11-01 MED ORDER — ONDANSETRON HCL 4 MG PO TABS: 4.0000 mg | ORAL_TABLET | Freq: Four times a day (QID) | ORAL | Status: DC | PRN

## 2017-11-01 MED ORDER — PREGABALIN 75 MG PO CAPS
75.0000 mg | ORAL_CAPSULE | Freq: Two times a day (BID) | ORAL | Status: DC
  Administered 2017-11-01 – 2017-11-02 (×2): 75 mg via ORAL
  Filled 2017-11-01 (×2): qty 1

## 2017-11-01 MED ORDER — ANASTROZOLE 1 MG PO TABS
1.0000 mg | ORAL_TABLET | Freq: Every day | ORAL | Status: DC
  Administered 2017-11-02: 1 mg via ORAL
  Filled 2017-11-01: qty 1

## 2017-11-01 MED ORDER — FENTANYL CITRATE (PF) 100 MCG/2ML IJ SOLN
INTRAMUSCULAR | Status: AC
  Administered 2017-11-01: 50 ug via INTRAVENOUS
  Filled 2017-11-01: qty 2

## 2017-11-01 MED ORDER — METHOCARBAMOL 1000 MG/10ML IJ SOLN
500.0000 mg | Freq: Four times a day (QID) | INTRAVENOUS | Status: DC | PRN
  Filled 2017-11-01: qty 5

## 2017-11-01 MED ORDER — FENTANYL CITRATE (PF) 250 MCG/5ML IJ SOLN
INTRAMUSCULAR | Status: AC
  Filled 2017-11-01: qty 5

## 2017-11-01 MED ORDER — 0.9 % SODIUM CHLORIDE (POUR BTL) OPTIME
TOPICAL | Status: DC | PRN
  Administered 2017-11-01: 1000 mL

## 2017-11-01 MED ORDER — DEXMEDETOMIDINE HCL IN NACL 200 MCG/50ML IV SOLN
INTRAVENOUS | Status: DC | PRN
  Administered 2017-11-01: 80 ug via INTRAVENOUS
  Administered 2017-11-01: 4 ug via INTRAVENOUS
  Administered 2017-11-01: 80 ug via INTRAVENOUS

## 2017-11-01 MED ORDER — ACETAMINOPHEN 325 MG PO TABS: 325.0000 mg | ORAL_TABLET | Freq: Four times a day (QID) | ORAL | Status: DC | PRN

## 2017-11-01 MED ORDER — ONDANSETRON HCL 4 MG/2ML IJ SOLN: 4.0000 mg | Freq: Four times a day (QID) | INTRAMUSCULAR | Status: DC | PRN

## 2017-11-01 MED ORDER — BISACODYL 10 MG RE SUPP: 10.0000 mg | Freq: Every day | RECTAL | Status: DC | PRN

## 2017-11-01 MED ORDER — BUPIVACAINE-EPINEPHRINE (PF) 0.5% -1:200000 IJ SOLN
INTRAMUSCULAR | Status: DC | PRN
  Administered 2017-11-01: 30 mL via PERINEURAL

## 2017-11-01 MED ORDER — MAGNESIUM CITRATE PO SOLN: 1.0000 | Freq: Once | ORAL | Status: DC | PRN

## 2017-11-01 MED ORDER — FENTANYL CITRATE (PF) 100 MCG/2ML IJ SOLN
50.0000 ug | Freq: Once | INTRAMUSCULAR | Status: AC
  Administered 2017-11-01: 50 ug via INTRAVENOUS

## 2017-11-01 MED ORDER — WARFARIN SODIUM 7.5 MG PO TABS
7.5000 mg | ORAL_TABLET | Freq: Once | ORAL | Status: AC
  Administered 2017-11-01: 7.5 mg via ORAL
  Filled 2017-11-01: qty 1

## 2017-11-01 MED ORDER — METOCLOPRAMIDE HCL 5 MG/ML IJ SOLN: 5.0000 mg | Freq: Three times a day (TID) | INTRAMUSCULAR | Status: DC | PRN

## 2017-11-01 MED ORDER — OXYCODONE HCL 5 MG PO TABS
5.0000 mg | ORAL_TABLET | ORAL | Status: DC | PRN
  Filled 2017-11-01: qty 2

## 2017-11-01 MED ORDER — CEFAZOLIN SODIUM-DEXTROSE 2-4 GM/100ML-% IV SOLN
2.0000 g | Freq: Four times a day (QID) | INTRAVENOUS | Status: AC
  Administered 2017-11-01 – 2017-11-02 (×3): 2 g via INTRAVENOUS
  Filled 2017-11-01 (×3): qty 100

## 2017-11-01 MED ORDER — INSULIN ASPART 100 UNIT/ML ~~LOC~~ SOLN: 0.0000 [IU] | Freq: Three times a day (TID) | SUBCUTANEOUS | Status: DC

## 2017-11-01 MED ORDER — EPHEDRINE SULFATE 50 MG/ML IJ SOLN
INTRAMUSCULAR | Status: DC | PRN
  Administered 2017-11-01: 10 mg via INTRAVENOUS

## 2017-11-01 MED ORDER — MIDAZOLAM HCL 2 MG/2ML IJ SOLN
2.0000 mg | Freq: Once | INTRAMUSCULAR | Status: AC
  Administered 2017-11-01: 2 mg via INTRAVENOUS

## 2017-11-01 MED ORDER — MIDAZOLAM HCL 2 MG/2ML IJ SOLN
INTRAMUSCULAR | Status: AC
  Administered 2017-11-01: 2 mg via INTRAVENOUS
  Filled 2017-11-01: qty 2

## 2017-11-01 SURGICAL SUPPLY — 51 items
BANDAGE ESMARK 6X9 LF (GAUZE/BANDAGES/DRESSINGS) ×1 IMPLANT
BIT DRILL KIT 2.65MM (BIT) IMPLANT
BLADE AVERAGE 25MMX9MM (BLADE) ×1
BLADE AVERAGE 25X9 (BLADE) ×2 IMPLANT
BLADE SURG 10 STRL SS (BLADE) IMPLANT
BNDG CMPR 9X6 STRL LF SNTH (GAUZE/BANDAGES/DRESSINGS) ×1
BNDG COHESIVE 4X5 TAN STRL (GAUZE/BANDAGES/DRESSINGS) ×3 IMPLANT
BNDG ESMARK 6X9 LF (GAUZE/BANDAGES/DRESSINGS) ×3
BNDG GAUZE ELAST 4 BULKY (GAUZE/BANDAGES/DRESSINGS) ×4 IMPLANT
BUR EGG ELITE 4.0 (BURR) ×1 IMPLANT
BUR EGG ELITE 4.0MM (BURR) ×1
COVER MAYO STAND STRL (DRAPES) IMPLANT
COVER SURGICAL LIGHT HANDLE (MISCELLANEOUS) ×6 IMPLANT
DRAPE OEC MINIVIEW 54X84 (DRAPES) IMPLANT
DRAPE U-SHAPE 47X51 STRL (DRAPES) ×3 IMPLANT
DRILL KIT 2.65MM (BIT) ×3
DRSG ADAPTIC 3X8 NADH LF (GAUZE/BANDAGES/DRESSINGS) ×3 IMPLANT
DURAPREP 26ML APPLICATOR (WOUND CARE) ×3 IMPLANT
ELECT REM PT RETURN 9FT ADLT (ELECTROSURGICAL) ×3
ELECTRODE REM PT RTRN 9FT ADLT (ELECTROSURGICAL) ×1 IMPLANT
GAUZE SPONGE 4X4 12PLY STRL (GAUZE/BANDAGES/DRESSINGS) ×3 IMPLANT
GAUZE SPONGE 4X4 12PLY STRL LF (GAUZE/BANDAGES/DRESSINGS) ×2 IMPLANT
GLOVE BIOGEL PI IND STRL 9 (GLOVE) ×1 IMPLANT
GLOVE BIOGEL PI INDICATOR 9 (GLOVE) ×2
GLOVE SURG ORTHO 9.0 STRL STRW (GLOVE) ×3 IMPLANT
GOWN STRL REUS W/ TWL LRG LVL3 (GOWN DISPOSABLE) ×1 IMPLANT
GOWN STRL REUS W/ TWL XL LVL3 (GOWN DISPOSABLE) ×1 IMPLANT
GOWN STRL REUS W/TWL LRG LVL3 (GOWN DISPOSABLE) ×3
GOWN STRL REUS W/TWL XL LVL3 (GOWN DISPOSABLE) ×3
GUIDEWIRE THREADED 2.8 (WIRE) ×4 IMPLANT
IMPL SPEED TITAN 20X20X20 (Orthopedic Implant) IMPLANT
IMPLANT SPEED TITAN 20X20X20 (Orthopedic Implant) ×6 IMPLANT
KIT BASIN OR (CUSTOM PROCEDURE TRAY) ×3 IMPLANT
KIT TURNOVER KIT B (KITS) ×3 IMPLANT
NS IRRIG 1000ML POUR BTL (IV SOLUTION) ×3 IMPLANT
PACK ORTHO EXTREMITY (CUSTOM PROCEDURE TRAY) ×3 IMPLANT
PAD ARMBOARD 7.5X6 YLW CONV (MISCELLANEOUS) ×6 IMPLANT
PUTTY BONE DBX 5CC MIX (Putty) ×2 IMPLANT
SCREW 6.5X70MM (Screw) ×2 IMPLANT
SCREW HL THREAD 6.5X65 (Screw) ×2 IMPLANT
SCREW LOCK (Screw) ×6 IMPLANT
SCREW LOCK FT 75X4.5XSLD ST NS (Screw) IMPLANT
SPONGE LAP 18X18 X RAY DECT (DISPOSABLE) ×3 IMPLANT
SUCTION FRAZIER HANDLE 10FR (MISCELLANEOUS) ×2
SUCTION TUBE FRAZIER 10FR DISP (MISCELLANEOUS) ×1 IMPLANT
SUT ETHILON 2 0 PSLX (SUTURE) ×9 IMPLANT
TOWEL OR 17X24 6PK STRL BLUE (TOWEL DISPOSABLE) ×3 IMPLANT
TOWEL OR 17X26 10 PK STRL BLUE (TOWEL DISPOSABLE) ×3 IMPLANT
TUBE CONNECTING 12'X1/4 (SUCTIONS) ×1
TUBE CONNECTING 12X1/4 (SUCTIONS) ×2 IMPLANT
WATER STERILE IRR 1000ML POUR (IV SOLUTION) ×3 IMPLANT

## 2017-11-01 NOTE — Anesthesia Procedure Notes (Signed)
Procedure Name: Intubation Date/Time: 11/01/2017 10:05 AM Performed by: Lance Coon, CRNA Pre-anesthesia Checklist: Emergency Drugs available, Patient identified, Suction available, Patient being monitored and Timeout performed Patient Re-evaluated:Patient Re-evaluated prior to induction Oxygen Delivery Method: Circle system utilized Preoxygenation: Pre-oxygenation with 100% oxygen Induction Type: IV induction Ventilation: Mask ventilation without difficulty Laryngoscope Size: Miller and 3 Grade View: Grade I Tube type: Oral Tube size: 7.0 mm Number of attempts: 1 Airway Equipment and Method: Stylet Placement Confirmation: ETT inserted through vocal cords under direct vision,  positive ETCO2 and breath sounds checked- equal and bilateral Secured at: 21 cm Tube secured with: Tape Dental Injury: Teeth and Oropharynx as per pre-operative assessment

## 2017-11-01 NOTE — Anesthesia Postprocedure Evaluation (Signed)
Anesthesia Post Note  Patient: [name]  Procedure(s) Performed: SUBTALAR AND TALONAVICULAR FUSION LEFT FOOT (Left )     Patient location during evaluation: PACU Anesthesia Type: General and Regional Level of consciousness: awake and alert Pain management: pain level controlled Vital Signs Assessment: post-procedure vital signs reviewed and stable Respiratory status: spontaneous breathing, nonlabored ventilation and respiratory function stable Cardiovascular status: blood pressure returned to baseline and stable Postop Assessment: no apparent nausea or vomiting Anesthetic complications: no    Last Vitals:  Vitals:   11/01/17 1200 11/01/17 1215  BP: 134/62 133/68  Pulse: 79 73  Resp: (!) 27 (!) 24  Temp:    SpO2: 100% 100%    Last Pain:  Vitals:   11/01/17 0818  PainSc: 0-No pain                 [name],W. EDMOND

## 2017-11-01 NOTE — Evaluation (Signed)
Physical Therapy Evaluation Patient Details Name: [name] MRN: [id number] DOB: September 18, 1950 Today's Date: 11/01/2017   History of Present Illness  Pt is a 68 y/o female s/p L ankle fusion. PMH includes DM, HTN, back surgery, R breast cancer, and fibromyalgia.   Clinical Impression  Pt is s/p surgery above with deficits below. Pt requiring mod A to stand and min A to transfer to chair using RW. Pt able to maintain NWB on LLE. Pt requesting to practice transfers with crutches, so will attempt in next session. Anticipate pt will progress well, and be able to d/c home with HHPT. If pt does not progress, may need to consider short term SNF. Will continue to follow acutely to maximize functional mobility independence and safety.     Follow Up Recommendations Home health PT;Supervision for mobility/OOB    Equipment Recommendations  3in1 (PT);Other (comment)(needs elevating leg rests for WC)    Recommendations for Other Services OT consult     Precautions / Restrictions Precautions Precautions: Fall Required Braces or Orthoses: Other Brace/Splint Other Brace/Splint: L CAM walker boot  Restrictions Weight Bearing Restrictions: Yes LLE Weight Bearing: Non weight bearing      Mobility  Bed Mobility Overal bed mobility: Needs Assistance Bed Mobility: Supine to Sit     Supine to sit: Supervision     General bed mobility comments: Supervision for safety.   Transfers Overall transfer level: Needs assistance Equipment used: Rolling walker (2 wheeled) Transfers: Sit to/from [facility] Sit to Stand: Mod assist;From elevated surface Stand pivot transfers: Min assist       General transfer comment: Mod A for steadying assist to stand. Verbal cues to maintain NWB on LLE and cues for hand placement. Min A For steadying for transfer to chair. Pt able to maintain NWB on LLE throughout transfer to chair. Verbal cues for upright posture.   Ambulation/Gait                Stairs            Wheelchair Mobility    Modified Rankin (Stroke Patients Only)       Balance Overall balance assessment: Needs assistance Sitting-balance support: No upper extremity supported;Feet supported Sitting balance-Leahy Scale: Good     Standing balance support: Bilateral upper extremity supported;During functional activity Standing balance-Leahy Scale: Poor Standing balance comment: Reliant on BUE support                              Pertinent Vitals/Pain Pain Assessment: No/denies pain    Home Living Family/patient expects to be discharged to:: Private residence Living Arrangements: Spouse/significant other Available Help at Discharge: Family;Available 24 hours/day Type of Home: House Home Access: Other (comment)(threshold step )     Home Layout: Two level;Able to live on main level with bedroom/bathroom Home Equipment: Gilford Rile - 2 wheels;Walker - 4 wheels;Cane - single point;Wheelchair - manual      Prior Function Level of Independence: Independent with assistive device(s)         Comments: Used cane for ambulation      Hand Dominance        Extremity/Trunk Assessment   Upper Extremity Assessment Upper Extremity Assessment: Defer to OT evaluation    Lower Extremity Assessment Lower Extremity Assessment: LLE deficits/detail LLE Deficits / Details: Pt with numbness in ankle and foot secondary to nerve block.        Communication   Communication: No difficulties  Cognition Arousal/Alertness: Awake/alert Behavior During Therapy: WFL for tasks assessed/performed Overall Cognitive Status: Within Functional Limits for tasks assessed                                        General Comments General comments (skin integrity, edema, etc.): Pt's husband present throughout session. All questions answered. Attempted to explain RW was safest option for transfer, however, pt adamant about wanting to try  transfers with crutches.     Exercises     Assessment/Plan    PT Assessment Patient needs continued PT services  PT Problem List Decreased strength;Decreased balance;Decreased mobility;Decreased knowledge of use of DME;Impaired sensation       PT Treatment Interventions DME instruction;Functional mobility training;Therapeutic activities;Therapeutic exercise;Balance training;Wheelchair mobility training;Patient/family education    PT Goals (Current goals can be found in the Care Plan section)  Acute Rehab PT Goals Patient Stated Goal: to go home tomorrow  PT Goal Formulation: With patient Time For Goal Achievement: 11/15/17 Potential to Achieve Goals: Good    Frequency Min 5X/week   Barriers to discharge        Co-evaluation               AM-PAC PT "6 Clicks" Daily Activity  Outcome Measure Difficulty turning over in bed (including adjusting bedclothes, sheets and blankets)?: A Little Difficulty moving from lying on back to sitting on the side of the bed? : A Little Difficulty sitting down on and standing up from a chair with arms (e.g., wheelchair, bedside commode, etc,.)?: Unable Help needed moving to and from a bed to chair (including a wheelchair)?: A Little Help needed walking in hospital room?: A Lot Help needed climbing 3-5 steps with a railing? : A Lot 6 Click Score: 14    End of Session Equipment Utilized During Treatment: Gait belt Activity Tolerance: Patient tolerated treatment well Patient left: in chair;with call bell/phone within reach;with family/visitor present Nurse Communication: Mobility status PT Visit Diagnosis: Unsteadiness on feet (R26.81);Muscle weakness (generalized) (M62.81)    Time: 5498-2641 PT Time Calculation (min) (ACUTE ONLY): 36 min   Charges:   PT Evaluation $PT Eval Low Complexity: 1 Low PT Treatments $Therapeutic Activity: 8-22 mins        Leighton Ruff, PT, DPT  Acute Rehabilitation Services  Pager: [phone] Office: [phone]   Rudean Hitt 11/01/2017, 5:34 PM

## 2017-11-01 NOTE — Progress Notes (Signed)
ANTICOAGULATION CONSULT NOTE - Initial Consult  Pharmacy Consult for Coumadin Indication: h/o PE  Allergies  Allergen Reactions  . Tape Other (See Comments)    Transpore Tape takes the skin off when removed. Can use paper tape    Patient Measurements:    Vital Signs: Temp: 97.5 F (36.4 C) (09/25 1341) Temp Source: Oral (09/25 1341) BP: 136/66 (09/25 1341) Pulse Rate: 78 (09/25 1341)  Labs: Recent Labs    11/01/17 0830  LABPROT 19.3*  INR 1.64    Estimated Creatinine Clearance: 64.3 mL/min (A) (by C-G formula based on SCr of 1.19 mg/dL (H)).   Medical History: Past Medical History:  Diagnosis Date  . Arthritis   . Bowel obstruction ([place])   . Cancer ([place]) 2015   Breast-right  . Diabetes mellitus without complication ([place])   . Fibromyalgia   . GERD (gastroesophageal reflux disease)   . Hypertension   . Presence of IVC filter 2010    Medications:  Medications Prior to Admission  Medication Sig Dispense Refill Last Dose  . acetaminophen (TYLENOL) 650 MG CR tablet Take 1,300 mg by mouth every 8 (eight) hours as needed for pain.   Past Month at Unknown time  . anastrozole (ARIMIDEX) 1 MG tablet Take 1 mg by mouth daily.    11/01/2017 at 0600  . esomeprazole (NEXIUM) 40 MG capsule Take 40 mg by mouth daily.    11/01/2017 at 0600  . losartan-hydrochlorothiazide (HYZAAR) 100-12.5 MG tablet Take 1 tablet by mouth daily.    10/31/2017 at Unknown time  . metFORMIN (GLUCOPHAGE) 1000 MG tablet Take 1,000 mg by mouth at bedtime.   10/31/2017 at Unknown time  . potassium chloride (K-DUR,KLOR-CON) 10 MEQ tablet Take 10 mEq by mouth daily as needed (Takes with Torsemide).    Past Month at Unknown time  . pregabalin (LYRICA) 75 MG capsule Take 75 mg by mouth 2 (two) times daily.    11/01/2017 at 0600  . SitaGLIPtin-MetFORMIN HCl 50-1000 MG TB24 Take 1 tablet by mouth every morning.    10/31/2017 at Unknown time  . torsemide (DEMADEX) 10 MG tablet Take 10 mg by mouth daily as needed  (swelling).   Past Month at Unknown time  . traMADol (ULTRAM) 50 MG tablet Take 2 tablets (100 mg total) by mouth every 6 (six) hours as needed for moderate pain. 60 tablet 0 11/01/2017 at 0600  . vitamin B-12 (CYANOCOBALAMIN) 1000 MCG tablet Take 1,000 mcg by mouth daily.   10/31/2017 at Unknown time  . Vitamin D, Ergocalciferol, (DRISDOL) 50000 units CAPS capsule Take 50,000 Units by mouth every 7 (seven) days. Sundays   Past Week at Unknown time  . warfarin (COUMADIN) 5 MG tablet Take 5 mg by mouth daily at 6 PM.    10/31/2017 at Unknown time  . loperamide (IMODIUM A-D) 2 MG tablet Take 2 mg by mouth 4 (four) times daily as needed for diarrhea or loose stools.   Unknown at Unknown time  . MAGNESIUM CITRATE PO Take 1 tablet by mouth daily.    Unknown at Unknown time    Assessment: 67 y.o. F presents for elective tendonitis repair - done 9/25. Pt on coumadin PTA (5mg  daily) for h/o PE - last dose taken 9/24 (not held for surgery). INR today down to 1.64 (subtherapeutic).  Pharmacy asked to dose coumadin while pt in hospital.  Goal of Therapy:  INR 2-3 Monitor platelets by anticoagulation protocol: Yes   Plan:  Coumadin 7.5mg  po tonight Will f/u daily  INR  Sherlon Handing, PharmD, BCPS Clinical pharmacist  **Pharmacist phone directory can now be found on [email or web address] ([email or web address]).  Listed under [place]. 11/01/2017,1:44 PM

## 2017-11-01 NOTE — Discharge Instructions (Signed)

## 2017-11-01 NOTE — Progress Notes (Signed)
CSW received SNF consult. Patient reports she will be going home with family/friend support. No social work needs identified at this time.   RNCM notified.   CSW signing off.   [place], [place]

## 2017-11-01 NOTE — Anesthesia Procedure Notes (Signed)
Anesthesia Regional Block: Popliteal block   Pre-Anesthetic Checklist: ,, timeout performed, Correct Patient, Correct Site, Correct Laterality, Correct Procedure, Correct Position, site marked, Risks and benefits discussed, pre-op evaluation,  At surgeon's request and post-op pain management  Laterality: Left  Prep: Maximum Sterile Barrier Precautions used, chloraprep       Needles:  Injection technique: Single-shot  Needle Type: Echogenic Stimulator Needle     Needle Length: 9cm  Needle Gauge: 21     Additional Needles:   Procedures:, nerve stimulator,,, ultrasound used (permanent image in chart),,,,   Nerve Stimulator or Paresthesia:  Response: Peroneal,  Response: Tibial,   Additional Responses:   Narrative:  Start time: 11/01/2017 8:25 AM End time: 11/01/2017 8:40 AM Injection made incrementally with aspirations every 5 mL. Anesthesiologist: Roderic Palau, MD  Additional Notes: 2% Lidocaine skin wheel. Saphenous block with 10cc of 0.5% Bupivicaine plain.

## 2017-11-01 NOTE — Op Note (Signed)
11/01/2017  11:21 AM  PATIENT:  [name]    PRE-OPERATIVE DIAGNOSIS:  Posterior Tibial Tendon Insufficiency Left Foot  POST-OPERATIVE DIAGNOSIS:  Same  PROCEDURE:  SUBTALAR AND TALONAVICULAR FUSION LEFT FOOT C-arm fluoroscopy to verify alignment.  SURGEON:  Newt Minion, MD  PHYSICIAN ASSISTANT:None ANESTHESIA:   General  PREOPERATIVE INDICATIONS:  [name] is a  67 y.o. female with a diagnosis of Posterior Tibial Tendon Insufficiency Left Foot who failed conservative measures and elected for surgical management.    The risks benefits and alternatives were discussed with the patient preoperatively including but not limited to the risks of infection, bleeding, nerve injury, cardiopulmonary complications, the need for revision surgery, among others, and the patient was willing to proceed.  OPERATIVE IMPLANTS: 6.5 mm headless screws x2 staples times 220 x 20 mm.  @ENCIMAGES @  OPERATIVE FINDINGS: Good petechial bleeding of the bone.  OPERATIVE PROCEDURE: Patient was brought the operating room and underwent a general anesthetic.  After adequate levels of anesthesia were obtained patient's left lower extremity was prepped using DuraPrep draped into a sterile field a timeout was called.  An oblique incision was made over the sinus Tarsi this was carried down to the posterior facet.  The peroneal tendons were protected using a oscillating saw the articular cartilage from the posterior facet was resected.  A curette was used to further debride this back to healthy bleeding subchondral bone.  After irrigation this posterior facet was packed with cancellus bone chips with demineralized bone matrix.  Attention was then focused dorsally over the talonavicular joint.  Incision was made dorsally between the EHL and anterior tibial tendon.  This was carried sharply down to bone.  Retractors were placed to protect the tendons.  The talonavicular joint was identified with C-arm  fluoroscopy and a bur was used to debride the articular cartilage from the talonavicular joint.  The joint was reduced after irrigation packed with demineralized bone matrix and putty and stabilized with 2 staples 20 x 20 mm.  Attention was then focused in the subtalar joint.  The ankle was held at 90 degrees neutral varus and valgus and 2 guidewires were placed from the calcaneus into the talus.  C-arm fluoroscopy verified alignment to 6.5 headless cannulated screws were then inserted.  C-arm fluoroscopy verified alignment.  Screws were 70 and 65 mm in length.  The incisions were then closed using 2-0 nylon.  A sterile dressing was applied patient was extubated taken the PACU in stable condition.   DISCHARGE PLANNING:  Antibiotic duration: 24-hour IV antibiotics  Weightbearing: Nonweightbearing on the left  Pain medication: High-dose opioid pathway  Dressing care/ Wound VAC: Keep dressing clean dry and intact for 1 week  Ambulatory devices: Walker and wheelchair  Discharge to: Anticipate discharge to home tomorrow.  Follow-up: In the office 1 week post operative.

## 2017-11-01 NOTE — H&P (Signed)
[name] is an 67 y.o. female.   Chief Complaint: Left foot pain and swelling. HPI: Patient is a 67 year old woman who complains of left foot pain for over 5 months.  She states that she was initially evaluated with diagnosis of posterior tibial tendon sprain.  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  She complains of pain and swelling.    Past Medical History:  Diagnosis Date  . Arthritis   . Bowel obstruction ([place])   . Cancer ([place]) 2015   Breast-right  . Diabetes mellitus without complication ([place])   . Fibromyalgia   . GERD (gastroesophageal reflux disease)   . Hypertension   . Presence of IVC filter 2010    Past Surgical History:  Procedure Laterality Date  . ABDOMINAL HYSTERECTOMY    . BACK SURGERY  2007, 2008, 2010   x3    No family history on file. Social History:  reports that she has never smoked. She has never used smokeless tobacco. She reports that she does not drink alcohol or use drugs.  Allergies:  Allergies  Allergen Reactions  . Tape Other (See Comments)    Transpore Tape takes the skin off when removed. Can use paper tape    No medications prior to admission.    No results found for this or any previous visit (from the past 48 hour(s)). No results found.  Review of Systems  All other systems reviewed and are negative.   There were no vitals taken for this visit. Physical Exam  Patient is alert, oriented, no adenopathy, well-dressed, normal affect, normal respiratory effort. On examination patient has an antalgic gait.  She has a positive too many toes sign with all the toes laterally she has significant valgus pronation and pace planus on the left.  She has a good pulse.  Radiographs shows calcification within the soft tissue no signs of bony infection.  Patient has a good dorsalis pedis pulse she cannot do a single limb heel raise.  She is tender to palpation  along the posterior tibial tendon as well as the sinus Tarsi.  Patient has an albumin of 2.3 with protein caloric malnutrition. Assessment/Plan 1. Planovalgus deformity of foot, acquired, left   2. Posterior tibial tendinitis, left leg   3. Body mass index 45.0-49.9, adult ([place])   4. Morbid obesity ([place])   5. Severe protein-calorie malnutrition ([place])     Plan: Due to failure conservative care patient states she would like to proceed with surgical intervention.  Would plan for subtalar and talonavicular fusion.  Risks and benefits of surgery were discussed including nonunion persistent pain need for additional surgery.  We will call to set this up.   Newt Minion, MD 11/01/2017, 6:56 AM

## 2017-11-01 NOTE — Transfer of Care (Signed)
Immediate Anesthesia Transfer of Care Note  Patient: [name]  Procedure(s) Performed: SUBTALAR AND TALONAVICULAR FUSION LEFT FOOT (Left )  Patient Location: PACU  Anesthesia Type:GA combined with regional for post-op pain  Level of Consciousness: awake and patient cooperative  Airway & Oxygen Therapy: Patient Spontanous Breathing  Post-op Assessment: Report given to RN and Post -op Vital signs reviewed and stable  Post vital signs: Reviewed and stable  Last Vitals:  Vitals Value Taken Time  BP 142/63 11/01/2017 11:17 AM  Temp    Pulse 84 11/01/2017 11:17 AM  Resp 13 11/01/2017 11:17 AM  SpO2 99 % 11/01/2017 11:17 AM  Vitals shown include unvalidated device data.  Last Pain:  Vitals:   11/01/17 0818  PainSc: 0-No pain         Complications: No apparent anesthesia complications

## 2017-11-02 ENCOUNTER — Encounter (HOSPITAL_COMMUNITY): Payer: Self-pay | Admitting: Orthopedic Surgery

## 2017-11-02 DIAGNOSIS — M76822 Posterior tibial tendinitis, left leg: Secondary | ICD-10-CM | POA: Diagnosis not present

## 2017-11-02 LAB — PROTIME-INR
INR: 2.21
PROTHROMBIN TIME: 24.3 s — AB (ref 11.4–15.2)

## 2017-11-02 LAB — GLUCOSE, CAPILLARY
GLUCOSE-CAPILLARY: 190 mg/dL — AB (ref 70–99)
GLUCOSE-CAPILLARY: 81 mg/dL (ref 70–99)

## 2017-11-02 MED ORDER — HYDROCODONE-ACETAMINOPHEN 5-325 MG PO TABS: 1.0000 | ORAL_TABLET | ORAL | 0 refills | Status: DC | PRN

## 2017-11-02 NOTE — Progress Notes (Signed)
Physical Therapy Treatment Patient Details Name: [name] MRN: [id number] DOB: 06/01/1950 Today's Date: 11/02/2017    History of Present Illness Pt is a 67 y/o female s/p L ankle fusion. PMH includes DM, HTN, back surgery, R breast cancer, and fibromyalgia.     PT Comments    Pt supine on arrival, PTA assisted patient OOB to chair, reviewed multiple transfer techniques with various ADs, and educated patient on stair negotiation this am to prepare for return home.  During trial with axillary crutches pt required assistance to manage therefore educated patient to use RW at home as she is more safe with that device.  Pt unable to negotiate curb ( 4 inch ), simulated trial with threshold of bathroom and patient able to perform.  Pt remains to present with poor safety and required education on technique.  Pt reports she has borrow a WC from the church and she reports the [facility] has elevating leg rests.  Pt trying to stand with L foot on ground and educated on  Weight bearing status and the importance of keeping foot elevated off the ground during standing activities.  Plan for home with husband this afternoon.     Follow Up Recommendations  Home health PT;Supervision for mobility/OOB     Equipment Recommendations  3in1 (PT);Other (comment)(reports she has a WC, RW at home.  )    Recommendations for Other Services OT consult     Precautions / Restrictions Precautions Precautions: Fall Required Braces or Orthoses: Other Brace/Splint Other Brace/Splint: L CAM walker boot  Restrictions Weight Bearing Restrictions: Yes LLE Weight Bearing: Non weight bearing    Mobility  Bed Mobility Overal bed mobility: Needs Assistance Bed Mobility: Supine to Sit     Supine to sit: Supervision     General bed mobility comments: No assistance needed supervision for safety.    Transfers Overall transfer level: Needs assistance Equipment used: Rolling walker (2 wheeled) Transfers: Sit to/from  Stand Sit to Stand: Min guard;Min assist(min assistance with axillary crutches and min guard with RW.  Pt lack balance, strength and coordination to use crutches for transfer training.  )         General transfer comment: Pt politely insisted on using cructches.  She was unable to follow commands and required both hands to push from seated surface laying crutches on bed and attempting to reach for them on the bed while in standing.  Pt required PTA to move crutches from bed to her for use.  During stand to sit she allowed crutches to fall when reaching for chair and PTA grabbed crutches to prevent the crutches from falling to the floor.  Pt is much safer with use of RW as it is able to stand on it's own and allows for an easier transition.  Pt requires cues for safety, weight bearing and hand placement during transfers with RW.    Ambulation/Gait Ambulation/Gait assistance: Min guard Gait Distance (Feet): 6 Feet(x2 trials)   Gait Pattern/deviations: Step-to pattern(hop to pattern)     General Gait Details: Pt able to hop short distances to move from surface to surface.     Stairs Stairs: Yes Stairs assistance: Min guard Stair Management: No rails;Forwards;With walker(1 inch stair ( practiced at threshold of bathroom) ) Number of Stairs: 1 General stair comments: Attempted stair negotiation instructing backwards approach to ascend and forwards approach to descend.  Pt reports she cannot do this and her step is only an inch.  Practiced negotiating threshold in bathroom  to simulate entry into home.  Pt able to do this forwards with RW.  Gave patient hand out on curb negotiation with WC as well in case she finds it difficult to negotiate her stair at home.     Wheelchair Mobility    Modified Rankin (Stroke Patients Only)       Balance Overall balance assessment: Needs assistance Sitting-balance support: No upper extremity supported;Feet supported Sitting balance-Leahy Scale: Good      Standing balance support: Bilateral upper extremity supported;During functional activity Standing balance-Leahy Scale: Poor Standing balance comment: Reliant on BUE support requires sitting for grooming tasks                            Cognition Arousal/Alertness: Awake/alert Behavior During Therapy: WFL for tasks assessed/performed Overall Cognitive Status: Within Functional Limits for tasks assessed                                        Exercises      General Comments General comments (skin integrity, edema, etc.): husband present throughout session      Pertinent Vitals/Pain Pain Assessment: Faces Faces Pain Scale: Hurts even more Pain Descriptors / Indicators: Burning;Grimacing;Guarding Pain Intervention(s): Monitored during session;Repositioned;Ice applied    Home Living Family/patient expects to be discharged to:: Private residence Living Arrangements: Spouse/significant other Available Help at Discharge: Family;Available 24 hours/day Type of Home: House Home Access: Other (comment)(threshold step )   Home Layout: Two level;Able to live on main level with bedroom/bathroom Home Equipment: Gilford Rile - 2 wheels;Walker - 4 wheels;Cane - single point;Wheelchair - manual;Shower seat;Hand held shower head      Prior Function Level of Independence: Independent with assistive device(s)      Comments: Used cane for ambulation    PT Goals (current goals can now be found in the care plan section) Acute Rehab PT Goals Patient Stated Goal: "get home today, eventually visit grandaughter in [place]" Potential to Achieve Goals: Good Additional Goals Additional Goal #1: Pt will be able to perform WC mobility >125' mod I to ensure safety with mobility. Progress towards PT goals: Progressing toward goals    Frequency    Min 5X/week      PT Plan Current plan remains appropriate    Co-evaluation              AM-PAC PT "6 Clicks" Daily  Activity  Outcome Measure  Difficulty turning over in bed (including adjusting bedclothes, sheets and blankets)?: A Little Difficulty moving from lying on back to sitting on the side of the bed? : A Little Difficulty sitting down on and standing up from a chair with arms (e.g., wheelchair, bedside commode, etc,.)?: A Lot Help needed moving to and from a bed to chair (including a wheelchair)?: A Little Help needed walking in hospital room?: A Little Help needed climbing 3-5 steps with a railing? : A Little 6 Click Score: 17    End of Session Equipment Utilized During Treatment: Gait belt Activity Tolerance: Patient tolerated treatment well Patient left: in chair;with call bell/phone within reach;with family/visitor present Nurse Communication: Mobility status PT Visit Diagnosis: Unsteadiness on feet (R26.81);Muscle weakness (generalized) (M62.81)     Time: 1751-0258 PT Time Calculation (min) (ACUTE ONLY): 43 min  Charges:  $Gait Training: 8-22 mins $Therapeutic Activity: 23-37 mins  Governor Rooks, PTA Acute Rehabilitation Services Pager [phone] Office [phone]     Cristela Blue 11/02/2017, 12:53 PM

## 2017-11-02 NOTE — Discharge Summary (Signed)
Discharge Diagnoses:  Active Problems:   Posterior tibial tendinitis of left leg   Surgeries: Procedure(s): SUBTALAR AND TALONAVICULAR FUSION LEFT FOOT on 11/01/2017    Consultants:   Discharged Condition: Improved  Hospital Course: [name] is an 67 y.o. female who was admitted 11/01/2017 with a chief complaint of left foot pain, with a final diagnosis of Posterior Tibial Tendon Insufficiency Left Foot.  Patient was brought to the operating room on 11/01/2017 and underwent Procedure(s): SUBTALAR AND TALONAVICULAR FUSION LEFT FOOT.    Patient was given perioperative antibiotics:  Anti-infectives (From admission, onward)   Start     Dose/Rate Route Frequency Ordered Stop   11/01/17 1600  ceFAZolin (ANCEF) IVPB 2g/100 mL premix     2 g 200 mL/hr over 30 Minutes Intravenous Every 6 hours 11/01/17 1341 11/02/17 0505   11/01/17 0600  ceFAZolin (ANCEF) 3 g in dextrose 5 % 50 mL IVPB     3 g 100 mL/hr over 30 Minutes Intravenous To Surgery 10/31/17 1101 11/01/17 1011    .  Patient was given sequential compression devices, early ambulation, and aspirin for DVT prophylaxis.  Recent vital signs:  Patient Vitals for the past 24 hrs:  BP Temp Temp src Pulse Resp SpO2 Height Weight  11/02/17 0418 127/61 98 F (36.7 C) Oral 79 18 98 % - -  11/01/17 2025 (!) 124/57 98.2 F (36.8 C) Oral 87 20 99 % - -  11/01/17 1403 - - - - - - 5\' 5"  (1.651 m) 133.5 kg  11/01/17 1341 136/66 (!) 97.5 F (36.4 C) Oral 78 - 96 % - -  11/01/17 1315 (!) 145/67 97.9 F (36.6 C) - (!) 101 (!) 27 100 % - -  11/01/17 1300 128/70 - - 77 11 99 % - -  11/01/17 1245 139/65 - - 76 12 99 % - -  11/01/17 1230 127/72 - - 77 15 99 % - -  11/01/17 1215 133/68 - - 73 (!) 24 100 % - -  11/01/17 1200 134/62 - - 79 (!) 27 100 % - -  11/01/17 1145 137/65 - - 77 16 96 % - -  11/01/17 1130 130/68 - - 78 17 99 % - -  11/01/17 1117 (!) 142/63 (!) 97.3 F (36.3 C) - 86 17 99 % - -  11/01/17 0947 - - - 80 14 100 % - -   11/01/17 0946 - - - 83 14 100 % - -  11/01/17 0945 - - - 86 15 100 % - -  11/01/17 0944 - - - 79 14 100 % - -  11/01/17 0943 (!) 145/61 - - 80 14 100 % - -  11/01/17 0942 - - - 84 13 100 % - -  11/01/17 0941 - - - 81 12 100 % - -  11/01/17 0940 - - - 81 12 100 % - -  11/01/17 0939 - - - 82 13 100 % - -  11/01/17 0938 (!) 148/59 - - 80 10 100 % - -  11/01/17 0937 - - - 82 14 100 % - -  11/01/17 0936 - - - 86 15 100 % - -  11/01/17 0935 - - - 83 16 100 % - -  11/01/17 0934 - - - 81 19 100 % - -  11/01/17 0933 (!) 139/57 - - 83 16 100 % - -  11/01/17 0932 - - - 87 16 98 % - -  11/01/17 0931 - - -  83 (!) 21 100 % - -  11/01/17 0930 (!) 147/67 - - 83 16 100 % - -  11/01/17 0929 - - - 85 20 100 % - -  11/01/17 0928 - - - 84 15 100 % - -  11/01/17 0927 - - - 84 15 100 % - -  11/01/17 0926 - - - 85 15 100 % - -  11/01/17 0925 (!) 155/58 - - 81 13 100 % - -  11/01/17 0924 - - - 81 14 100 % - -  11/01/17 0923 - - - 81 11 100 % - -  11/01/17 0922 - - - 86 12 100 % - -  11/01/17 0921 - - - 85 14 100 % - -  11/01/17 0920 (!) 156/60 - - 81 11 100 % - -  11/01/17 0919 - - - 84 15 100 % - -  11/01/17 0918 - - - 82 (!) 21 100 % - -  11/01/17 0917 - - - 84 15 100 % - -  11/01/17 0916 - - - 81 14 100 % - -  11/01/17 0915 (!) 151/60 - - 83 13 100 % - -  11/01/17 0914 - - - 85 15 100 % - -  11/01/17 0913 - - - 81 16 100 % - -  11/01/17 0912 - - - 82 20 100 % - -  11/01/17 0911 - - - 86 18 100 % - -  11/01/17 0910 140/60 - - 83 13 100 % - -  11/01/17 0909 - - - 80 16 100 % - -  11/01/17 0908 - - - 79 13 100 % - -  11/01/17 0907 - - - 82 17 100 % - -  11/01/17 0906 - - - 83 13 100 % - -  11/01/17 0905 138/62 - - 84 15 100 % - -  11/01/17 0904 - - - 84 15 100 % - -  11/01/17 0903 - - - 82 16 100 % - -  11/01/17 0902 - - - 81 15 100 % - -  11/01/17 0901 - - - 84 13 100 % - -  11/01/17 0900 (!) 140/58 - - 84 16 100 % - -  11/01/17 0855 (!) 148/65 - - 79 15 100 % - -  11/01/17 0840 (!) 150/81  - - 82 15 100 % - -  11/01/17 0835 (!) 157/83 - - 82 (!) 24 99 % - -  11/01/17 0751 140/66 98.7 F (37.1 C) - 82 20 100 % - -  .  Recent laboratory studies: No results found.  Discharge Medications:   Allergies as of 11/02/2017      Reactions   Tape Other (See Comments)   Transpore Tape takes the skin off when removed. Can use paper tape      Medication List    TAKE these medications   acetaminophen 650 MG CR tablet Commonly known as:  TYLENOL Take 1,300 mg by mouth every 8 (eight) hours as needed for pain.   anastrozole 1 MG tablet Commonly known as:  ARIMIDEX Take 1 mg by mouth daily.   esomeprazole 40 MG capsule Commonly known as:  NEXIUM Take 40 mg by mouth daily.   HYDROcodone-acetaminophen 5-325 MG tablet Commonly known as:  NORCO/VICODIN Take 1 tablet by mouth every 4 (four) hours as needed for moderate pain.   loperamide 2 MG tablet Commonly known as:  IMODIUM A-D Take 2 mg by mouth 4 (  four) times daily as needed for diarrhea or loose stools.   losartan-hydrochlorothiazide 100-12.5 MG tablet Commonly known as:  HYZAAR Take 1 tablet by mouth daily.   MAGNESIUM CITRATE PO Take 1 tablet by mouth daily.   metFORMIN 1000 MG tablet Commonly known as:  GLUCOPHAGE Take 1,000 mg by mouth at bedtime.   potassium chloride 10 MEQ tablet Commonly known as:  K-DUR,KLOR-CON Take 10 mEq by mouth daily as needed (Takes with Torsemide).   pregabalin 75 MG capsule Commonly known as:  LYRICA Take 75 mg by mouth 2 (two) times daily.   SitaGLIPtin-MetFORMIN HCl 50-1000 MG Tb24 Take 1 tablet by mouth every morning.   torsemide 10 MG tablet Commonly known as:  DEMADEX Take 10 mg by mouth daily as needed (swelling).   traMADol 50 MG tablet Commonly known as:  ULTRAM Take 2 tablets (100 mg total) by mouth every 6 (six) hours as needed for moderate pain.   vitamin B-12 1000 MCG tablet Commonly known as:  CYANOCOBALAMIN Take 1,000 mcg by mouth daily.   Vitamin D  (Ergocalciferol) 50000 units Caps capsule Commonly known as:  DRISDOL Take 50,000 Units by mouth every 7 (seven) days. Sundays   warfarin 5 MG tablet Commonly known as:  COUMADIN Take 5 mg by mouth daily at 6 PM.            Discharge Care Instructions  (From admission, onward)         Start     Ordered   11/02/17 0000  Non weight bearing    Question Answer Comment  Laterality left   Extremity Lower      09 /26/19 0644          Diagnostic Studies: No results found.  Patient benefited maximally from their hospital stay and there were no complications.     Disposition:  Discharge Instructions    Elevate operative extremity   Complete by:  As directed    Non weight bearing   Complete by:  As directed    Laterality:  left   Extremity:  Lower     Follow-up Information    Newt Minion, MD In 1 week.   Specialty:  Orthopedic Surgery Contact information: [address] [place] [address] [phone]            Signed: Newt Minion 11/02/2017, 6:44 AM

## 2017-11-02 NOTE — Progress Notes (Signed)
Discharge instructions completed with pt.  Pt verbalized understanding of the information.  Pt denies chest pain, shortness of breath, dizziness, lightheadedness, and n/v.  Pt's IV discontinued.  Pt stated she wanted to eat lunch before going home. Will discharge pt after she eats lunch.

## 2017-11-02 NOTE — Plan of Care (Signed)

## 2017-11-02 NOTE — Evaluation (Signed)
Occupational Therapy Evaluation and Discharge Patient Details Name: [name] MRN: [id number] DOB: January 31, 1951 Today's Date: 11/02/2017    History of Present Illness Pt is a 67 y/o female s/p L ankle fusion. PMH includes DM, HTN, back surgery, R breast cancer, and fibromyalgia.    Clinical Impression   PTA pt independent in ADL and mobility. Pt is currentlty min guard for in room mobility and transfers with RW, able to maintain NWB through LLE. Pt requires max A for LB dressing, and unable to maintain NWB for standing grooming/ADL tasks - however Pt able to complete with set up in sitting. Educated on Advertising copywriter for transfers, and Administrator choices and dressing sequence. Pt and husband with no further questions or concerns at the end of the session. OT to sign off at this time. Thank you for the opportunity to serve this patient and her family.     Follow Up Recommendations  No OT follow up;Supervision - Intermittent    Equipment Recommendations  None recommended by OT(Pt has appropriate DME)    Recommendations for Other Services  None     Precautions / Restrictions Precautions Precautions: Fall Required Braces or Orthoses: Other Brace/Splint Other Brace/Splint: L CAM walker boot  Restrictions Weight Bearing Restrictions: Yes LLE Weight Bearing: Non weight bearing      Mobility Bed Mobility   Bed Mobility: Supine to Sit     Supine to sit: Supervision     General bed mobility comments: Pt in recliner at beginning and end of sesion  Transfers Overall transfer level: Needs assistance Equipment used: Rolling walker (2 wheeled) Transfers: Sit to/from Stand Sit to Stand: Min guard         General transfer comment: vc for safe hand placement    Balance Overall balance assessment: Needs assistance Sitting-balance support: No upper extremity supported;Feet supported Sitting balance-Leahy Scale: Good     Standing balance support: Bilateral upper  extremity supported;During functional activity Standing balance-Leahy Scale: Poor Standing balance comment: Reliant on BUE support requires sitting for grooming tasks                           ADL either performed or assessed with clinical judgement   ADL Overall ADL's : Needs assistance/impaired Eating/Feeding: Independent   Grooming: Wash/dry hands;Wash/dry face;Oral care;Modified independent;Sitting Grooming Details (indicate cue type and reason): cannot maintain standing for grooming tasks, requires sitting to maintain NWB Upper Body Bathing: Set up;With adaptive equipment;Sitting   Lower Body Bathing: Set up;With adaptive equipment;Sitting/lateral leans Lower Body Bathing Details (indicate cue type and reason): edcuated on long handle sponge, and precautions for "waterproofing" for bathing Upper Body Dressing : Set up;Sitting   Lower Body Dressing: Maximal assistance;With caregiver independent assisting;Sitting/lateral leans;Sit to/from stand Lower Body Dressing Details (indicate cue type and reason): educated in types of clothing that are "smarter" choices with boot, and to assist with ease of toileting Toilet Transfer: Min guard;Ambulation;RW Toilet Transfer Details (indicate cue type and reason): use fo grab bar and vs for safety with hand placement Toileting- Clothing Manipulation and Hygiene: Min guard;Sit to/from stand   Tub/ Banker: Walk-in shower;Ambulation;Rolling walker;Shower seat   Functional mobility during ADLs: Min guard;Rolling walker       Vision Patient Visual Report: No change from baseline Vision Assessment?: No apparent visual deficits     Perception     Praxis      Pertinent Vitals/Pain Pain Assessment: No/denies pain(block still in place)  Hand Dominance     Extremity/Trunk Assessment Upper Extremity Assessment Upper Extremity Assessment: Overall WFL for tasks assessed   Lower Extremity Assessment Lower Extremity  Assessment: LLE deficits/detail LLE Deficits / Details: Pt with numbness in ankle and foot secondary to nerve block.        Communication Communication Communication: No difficulties   Cognition Arousal/Alertness: Awake/alert Behavior During Therapy: WFL for tasks assessed/performed Overall Cognitive Status: Within Functional Limits for tasks assessed                                     General Comments  husband present throughout session    Exercises     Shoulder Instructions      Home Living Family/patient expects to be discharged to:: Private residence Living Arrangements: Spouse/significant other Available Help at Discharge: Family;Available 24 hours/day Type of Home: House Home Access: Other (comment)(threshold step )     Home Layout: Two level;Able to live on main level with bedroom/bathroom     Bathroom Shower/Tub: Tub/shower unit;Walk-in shower   Bathroom Toilet: Standard Bathroom Accessibility: Yes How Accessible: Accessible via walker Home Equipment: [place] - 2 wheels;Walker - 4 wheels;Cane - single point;Wheelchair - manual;Shower seat;Hand held shower head          Prior Functioning/Environment Level of Independence: Independent with assistive device(s)        Comments: Used cane for ambulation         OT Problem List: Decreased activity tolerance;Impaired balance (sitting and/or standing);Decreased knowledge of use of DME or AE;Decreased knowledge of precautions;Obesity      OT Treatment/Interventions:      OT Goals(Current goals can be found in the care plan section) Acute Rehab OT Goals Patient Stated Goal: "get home today, eventually visit grandaughter in [facility]" OT Goal Formulation: With patient/family Time For Goal Achievement: 11/16/17 Potential to Achieve Goals: Good  OT Frequency:     Barriers to D/C:            Co-evaluation              AM-PAC PT "6 Clicks" Daily Activity     Outcome Measure Help  from another person eating meals?: None Help from another person taking care of personal grooming?: None(insitting) Help from another person toileting, which includes using toliet, bedpan, or urinal?: A Little Help from another person bathing (including washing, rinsing, drying)?: A Little Help from another person to put on and taking off regular upper body clothing?: None Help from another person to put on and taking off regular lower body clothing?: A Lot 6 Click Score: 20   End of Session Equipment Utilized During Treatment: Gait belt;Rolling walker;Other (comment)(CAM boot) Nurse Communication: Mobility status;Precautions;Weight bearing status  Activity Tolerance: Patient tolerated treatment well Patient left: in chair;with call bell/phone within reach;with family/visitor present  OT Visit Diagnosis: Unsteadiness on feet (R26.81);Other abnormalities of gait and mobility (R26.89)                Time: 7846-9629 OT Time Calculation (min): 24 min Charges:  OT General Charges $OT Visit: 1 Visit OT Evaluation $OT Eval Moderate Complexity: [place] OTR/L Acute Rehabilitation Services Pager: [phone] Office: [place] 11/02/2017, 12:22 PM

## 2017-11-02 NOTE — Care Management Note (Addendum)
Case Management Note  Patient Details  Name: [name] MRN: [id number] Date of Birth: 1950/12/18  Subjective/Objective: 67 yr old female s/p left ankle fusion.                  Action/Plan: Case manager spoke with patient and husband concerning discharge plan. Choice for [place] was offered, patient lives in [place], [place], referral called to [place].CM faxed discharge summary, Face to Face, OP note, H&P to [phone]. Case manager contacted patient on her cell  [phone] to notify her that [facility] will be accepting her for care. She will have family support at discharge.    Expected Discharge Date:  11/02/17               Expected Discharge Plan:  [place]  In-House Referral:  NA  Discharge planning Services  CM Consult  Post Acute Care Choice:  Home Health Choice offered to:  Patient, Spouse  DME Arranged:  N/A(has RW) DME Agency:  NA  HH Arranged:  PT HH Agency:  Other - See comment([facility])  Status of Service:  Completed, signed off  If discussed at [place] of Stay Meetings, dates discussed:    Additional Comments:  Ninfa Meeker, RN 11/02/2017, 2:37 PM

## 2017-11-07 ENCOUNTER — Telehealth (INDEPENDENT_AMBULATORY_CARE_PROVIDER_SITE_OTHER): Payer: Self-pay | Admitting: Orthopedic Surgery

## 2017-11-07 NOTE — Telephone Encounter (Signed)
Pt has an appt tomorrow will hold for Dr. Sharol Given to eval and then cal to advise.

## 2017-11-07 NOTE — Telephone Encounter (Signed)
Received call from [place] (PT) from [facility] stating patient is non weight bearing on left lower extremity. Merrie Roof advised needing verbal order for just toe touch weight bearing only to help patient balance. The number to contact Merrie Roof is [phone]  The fax# is [phone]

## 2017-11-08 ENCOUNTER — Ambulatory Visit (INDEPENDENT_AMBULATORY_CARE_PROVIDER_SITE_OTHER): Payer: Medicare Other | Admitting: Physician Assistant

## 2017-11-08 ENCOUNTER — Other Ambulatory Visit (INDEPENDENT_AMBULATORY_CARE_PROVIDER_SITE_OTHER): Payer: Self-pay

## 2017-11-08 ENCOUNTER — Encounter (INDEPENDENT_AMBULATORY_CARE_PROVIDER_SITE_OTHER): Payer: Self-pay | Admitting: Physician Assistant

## 2017-11-08 VITALS — Ht 65.0 in | Wt 294.3 lb

## 2017-11-08 DIAGNOSIS — Z981 Arthrodesis status: Secondary | ICD-10-CM

## 2017-11-08 DIAGNOSIS — M76822 Posterior tibial tendinitis, left leg: Secondary | ICD-10-CM

## 2017-11-08 MED ORDER — OXYCODONE-ACETAMINOPHEN 5-325 MG PO TABS: 1.0000 | ORAL_TABLET | ORAL | 0 refills | Status: DC | PRN

## 2017-11-08 NOTE — Progress Notes (Signed)
Office Visit Note   Patient: [name]           Date of Birth: 04-03-1950           MRN: [id number] Visit Date: 11/08/2017              Requested by: Galen Manila, [place] [facility] [place] [address] PCP: Galen Manila, MD  Chief Complaint  Patient presents with  . Left Ankle - Routine Post Op  . Left Foot - Routine Post Op      HPI: Patient is a 67 year old female who comes in today with her husband for postoperative follow-up following left foot subtalar and talonavicular fusion on 11/01/2017.  She is been nonweightbearing in her walker boot using a walker or crutches, or wheelchair for longer distances.  She reports her pain is poorly controlled with hydrocodone and she has taken 2 of these at times.  She has been trying to keep her foot elevated as much as possible.  Assessment & Plan: Visit Diagnoses:  1. Status post ankle fusion   2. Posterior tibial tendinitis of left leg     Plan: Plan Daily dressing changes with gauze and Ace wrapping from toes to calf for edema control.  Will try Percocet 5/325 mg 1-2 p.o. every 4 hours as needed pain for pain control.  Counseled patient she may use ice as well.  Instructed to continue elevation higher than the level of her heart as much as possible.  Instructed she may be out of her walker boot when in bed or in recliner, but she does wear at all times when she is going to be up ambulating.  Continue nonweightbearing except for balancing on the left heel PRN briefly for balance.  She can continue gait training with her walker or crutches.  Plan to harvest the sutures next week if edema sufficiently resolved.  She will follow-up in 1 week.  Follow-Up Instructions: Return in about 1 week (around 11/15/2017).   Ortho Exam  Patient is alert, oriented, no adenopathy, well-dressed, normal affect, normal respiratory effort. Incisions over the left foot ankle and heel are intact with sutures in place.  She does have  moderate edema of the foot ankle and calf.  No signs of cellulitis.  Neurovascularly intact distally at the toes.  She has a good dorsalis pedis pulse.  Imaging: No results found. No images are attached to the encounter.  Labs: Lab Results  Component Value Date   HGBA1C 6.7 (H) 10/24/2017   HGBA1C  04/30/2008    6.0 (NOTE)   The ADA recommends the following therapeutic goal for glycemic   control related to Hgb A1C measurement:   Goal of Therapy:   < 7.0% Hgb A1C   Reference: American Diabetes Association: Clinical Practice   Recommendations 2008, Diabetes Care,  2008, 31:(Suppl 1).   REPTSTATUS 05/04/2008 FINAL 05/03/2008   GRAMSTAIN  04/29/2008    ABUNDANT WBC PRESENT,BOTH PMN AND MONONUCLEAR NO ORGANISMS SEEN Gram Stain Report Called to,Read Back By and Verified With: Gram Stain Report Called to,Read Back By and Verified With: JOSH RN [id number] ON 04/29/08 BY WILSONM Performed at [facility]  04/29/2008    ABUNDANT WBC PRESENT,BOTH PMN AND MONONUCLEAR NO ORGANISMS SEEN Gram Stain Report Called to,Read Back By and Verified With: Gram Stain Report Called to,Read Back By and Verified With: JOSH RN [id number] ON 04/29/08 Performed at [facility]  04/29/2008  ABUNDANT WBC PRESENT,BOTH PMN AND MONONUCLEAR NO ORGANISMS SEEN Gram Stain Report Called to,Read Back By and Verified With: Josh RN 11:05 04/29/08 ([email or web address])   CULT YEAST 05/03/2008     Lab Results  Component Value Date   ALBUMIN 2.3 (L) 05/22/2008   ALBUMIN 2.3 (L) 05/21/2008   ALBUMIN 2.3 (L) 05/19/2008   PREALBUMIN 13.1 (L) 05/18/2008    Body mass index is 48.97 kg/m.  Orders:  No orders of the defined types were placed in this encounter.  Meds ordered this encounter  Medications  . oxyCODONE-acetaminophen (PERCOCET/ROXICET) 5-325 MG tablet    Sig: Take 1 tablet by mouth every 4 (four) hours as needed for moderate pain or severe pain.    Dispense:  40 tablet    Refill:  0      Procedures: No procedures performed  Clinical Data: No additional findings.  ROS:  All other systems negative, except as noted in the HPI. Review of Systems  Objective: Vital Signs: Ht 5\' 5"  (1.651 m)   Wt 294 lb 4.8 oz (133.5 kg)   BMI 48.97 kg/m   Specialty Comments:  No specialty comments available.  PMFS History: Patient Active Problem List   Diagnosis Date Noted  . Posterior tibial tendinitis of left leg 11/01/2017  . Posterior tibial tendinitis, left leg 10/02/2017  . Planovalgus deformity of foot, acquired, left 10/02/2017   Past Medical History:  Diagnosis Date  . Arthritis   . Bowel obstruction ([place])   . Cancer ([place]) 2015   Breast-right  . Diabetes mellitus without complication ([place])   . Fibromyalgia   . GERD (gastroesophageal reflux disease)   . Hypertension   . Presence of IVC filter 2010    History reviewed. No pertinent family history.  Past Surgical History:  Procedure Laterality Date  . ABDOMINAL HYSTERECTOMY    . ANKLE FUSION Left 11/01/2017   Procedure: SUBTALAR AND TALONAVICULAR FUSION LEFT FOOT;  Surgeon: Newt Minion, MD;  Location: [facility];  Service: Orthopedics;  Laterality: Left;  . BACK SURGERY  2007, 2008, 2010   x3   Social History   Occupational History  . Not on file  Tobacco Use  . Smoking status: Never Smoker  . Smokeless tobacco: Never Used  Substance and Sexual Activity  . Alcohol use: No  . Drug use: No  . Sexual activity: Not on file

## 2017-11-08 NOTE — Telephone Encounter (Signed)
PT was sent over to [facility] for patient.

## 2017-11-08 NOTE — Telephone Encounter (Signed)
Ok for PT eval and treat with WTB through the heel only for transitions and balance.

## 2017-11-09 ENCOUNTER — Telehealth (INDEPENDENT_AMBULATORY_CARE_PROVIDER_SITE_OTHER): Payer: Self-pay | Admitting: Orthopedic Surgery

## 2017-11-09 ENCOUNTER — Other Ambulatory Visit (INDEPENDENT_AMBULATORY_CARE_PROVIDER_SITE_OTHER): Payer: Self-pay

## 2017-11-09 NOTE — Telephone Encounter (Signed)
Can you please call pt to advise of orders from yesterday. thanks

## 2017-11-09 NOTE — Telephone Encounter (Signed)
Re-faxing orders to PT; Merrie Roof didn't receive it yesterday. Spoke to him and verbally confirmed orders via phone.

## 2017-11-09 NOTE — Telephone Encounter (Signed)
Thanks

## 2017-11-09 NOTE — Telephone Encounter (Signed)
[name], PT at [facility] believes some orders were faxed over yesterday but they only received a cover sheet. He is requesting this be refaxed or you can call him with verbals.  Fax # [phone] Phone # [phone]

## 2017-11-09 NOTE — Telephone Encounter (Signed)
[name] from [facility] left a message requesting that you give him a call back.  [phone].  Thank you.

## 2017-11-15 ENCOUNTER — Ambulatory Visit (INDEPENDENT_AMBULATORY_CARE_PROVIDER_SITE_OTHER): Payer: Medicare Other | Admitting: Specialist

## 2017-11-16 ENCOUNTER — Encounter (INDEPENDENT_AMBULATORY_CARE_PROVIDER_SITE_OTHER): Payer: Self-pay | Admitting: Orthopedic Surgery

## 2017-11-16 ENCOUNTER — Ambulatory Visit (INDEPENDENT_AMBULATORY_CARE_PROVIDER_SITE_OTHER): Payer: Medicare Other | Admitting: Orthopedic Surgery

## 2017-11-16 ENCOUNTER — Ambulatory Visit (INDEPENDENT_AMBULATORY_CARE_PROVIDER_SITE_OTHER): Payer: Medicare Other

## 2017-11-16 VITALS — Ht 65.0 in | Wt 294.0 lb

## 2017-11-16 DIAGNOSIS — Z981 Arthrodesis status: Secondary | ICD-10-CM

## 2017-11-16 DIAGNOSIS — M216X2 Other acquired deformities of left foot: Secondary | ICD-10-CM

## 2017-11-16 DIAGNOSIS — M76822 Posterior tibial tendinitis, left leg: Secondary | ICD-10-CM

## 2017-11-16 MED ORDER — OXYCODONE-ACETAMINOPHEN 5-325 MG PO TABS: 1.0000 | ORAL_TABLET | ORAL | 0 refills | Status: DC | PRN

## 2017-11-16 NOTE — Progress Notes (Signed)
Office Visit Note   Patient: [name]           Date of Birth: Jun 19, 1950           MRN: [id number] Visit Date: 11/16/2017              Requested by: Galen Manila, [facility] [place] [address] PCP: Galen Manila, MD  Chief Complaint  Patient presents with  . 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  Assessment & Plan: Visit Diagnoses:  1. Status post ankle fusion   2. Posterior tibial tendinitis, left leg   3. Planovalgus deformity of foot, acquired, left     Plan: Patient will continue with the fracture boot continue nonweightbearing she was given a prescription for compression stockings for the venous swelling.  Follow-Up Instructions: Return in about 3 weeks (around 12/07/2017).   Ortho Exam  Patient is alert, oriented, no adenopathy, well-dressed, normal affect, normal respiratory effort. Examination patient is ambulating in a wheelchair.  She has venous swelling in the leg and forefoot.  The incisions are well-healed there is no redness no cellulitis no drainage no signs of infection.  Patient's foot is straight there is no valgus deformity to the forefoot.  There is no pain with range of motion of the ankle.  Imaging: Xr Ankle Complete Left  Result Date: 11/16/2017 2 view radiographs of the left foot shows stable fixation of the talonavicular and subtalar joint no hardware failure no complicating features.  No images are attached to the encounter.  Labs: Lab Results  Component Value Date   HGBA1C 6.7 (H) 10/24/2017   HGBA1C  04/30/2008    6.0 (NOTE)   The ADA recommends the following therapeutic goal for glycemic   control related to Hgb A1C measurement:   Goal of Therapy:   < 7.0% Hgb A1C   Reference: American Diabetes Association: Clinical Practice    Recommendations 2008, Diabetes Care,  2008, 31:(Suppl 1).   REPTSTATUS 05/04/2008 FINAL 05/03/2008   GRAMSTAIN  04/29/2008    ABUNDANT WBC PRESENT,BOTH PMN AND MONONUCLEAR NO ORGANISMS SEEN Gram Stain Report Called to,Read Back By and Verified With: Gram Stain Report Called to,Read Back By and Verified With: JOSH RN [id number] ON 04/29/08 BY WILSONM Performed at [place]  04/29/2008    ABUNDANT WBC PRESENT,BOTH PMN AND MONONUCLEAR NO ORGANISMS SEEN Gram Stain Report Called to,Read Back By and Verified With: Gram Stain Report Called to,Read Back By and Verified With: JOSH RN [id number] ON 04/29/08 Performed at [place]  04/29/2008    ABUNDANT WBC PRESENT,BOTH PMN AND MONONUCLEAR NO ORGANISMS SEEN Gram Stain Report Called to,Read Back By and Verified With: Josh RN 11:05 04/29/08 ([email or web address])   CULT YEAST 05/03/2008     Lab Results  Component Value Date   ALBUMIN 2.3 (L) 05/22/2008   ALBUMIN 2.3 (L) 05/21/2008   ALBUMIN 2.3 (L) 05/19/2008   PREALBUMIN 13.1 (L) 05/18/2008    Body mass index is 48.92 kg/m.  Orders:  Orders Placed This Encounter  Procedures  . XR Ankle Complete Left   Meds ordered this encounter  Medications  . oxyCODONE-acetaminophen (PERCOCET/ROXICET) 5-325 MG tablet    Sig: Take 1 tablet by mouth every 4 (four) hours as needed for  moderate pain or severe pain.    Dispense:  40 tablet    Refill:  0     Procedures: No procedures performed  Clinical Data: No additional findings.  ROS:  All other systems negative, except as noted in the HPI. Review of Systems  Objective: Vital Signs: Ht 5\' 5"  (1.651 m)   Wt 294 lb (133.4 kg)   BMI 48.92 kg/m   Specialty Comments:  No specialty comments available.  PMFS History: Patient Active Problem List   Diagnosis Date Noted  . Posterior tibial tendinitis of left leg 11/01/2017  . Posterior tibial tendinitis, left leg 10/02/2017  . Planovalgus deformity of foot, acquired,  left 10/02/2017   Past Medical History:  Diagnosis Date  . Arthritis   . Bowel obstruction ([place])   . Cancer ([place]) 2015   Breast-right  . Diabetes mellitus without complication ([place])   . Fibromyalgia   . GERD (gastroesophageal reflux disease)   . Hypertension   . Presence of IVC filter 2010    History reviewed. No pertinent family history.  Past Surgical History:  Procedure Laterality Date  . ABDOMINAL HYSTERECTOMY    . ANKLE FUSION Left 11/01/2017   Procedure: SUBTALAR AND TALONAVICULAR FUSION LEFT FOOT;  Surgeon: Newt Minion, MD;  Location: [facility];  Service: Orthopedics;  Laterality: Left;  . BACK SURGERY  2007, 2008, 2010   x3   Social History   Occupational History  . Not on file  Tobacco Use  . Smoking status: Never Smoker  . Smokeless tobacco: Never Used  Substance and Sexual Activity  . Alcohol use: No  . Drug use: No  . Sexual activity: Not on file

## 2017-12-07 ENCOUNTER — Ambulatory Visit (INDEPENDENT_AMBULATORY_CARE_PROVIDER_SITE_OTHER): Payer: Medicare Other

## 2017-12-07 ENCOUNTER — Encounter (INDEPENDENT_AMBULATORY_CARE_PROVIDER_SITE_OTHER): Payer: Self-pay | Admitting: Orthopedic Surgery

## 2017-12-07 ENCOUNTER — Ambulatory Visit (INDEPENDENT_AMBULATORY_CARE_PROVIDER_SITE_OTHER): Payer: Medicare Other | Admitting: Physician Assistant

## 2017-12-07 VITALS — Ht 65.0 in | Wt 294.0 lb

## 2017-12-07 DIAGNOSIS — Z981 Arthrodesis status: Secondary | ICD-10-CM

## 2017-12-07 MED ORDER — OXYCODONE-ACETAMINOPHEN 5-325 MG PO TABS: 1.0000 | ORAL_TABLET | ORAL | 0 refills | Status: DC | PRN

## 2017-12-08 ENCOUNTER — Encounter (INDEPENDENT_AMBULATORY_CARE_PROVIDER_SITE_OTHER): Payer: Self-pay | Admitting: Physician Assistant

## 2017-12-08 ENCOUNTER — Telehealth (INDEPENDENT_AMBULATORY_CARE_PROVIDER_SITE_OTHER): Payer: Self-pay | Admitting: Orthopedic Surgery

## 2017-12-08 ENCOUNTER — Other Ambulatory Visit (INDEPENDENT_AMBULATORY_CARE_PROVIDER_SITE_OTHER): Payer: Self-pay

## 2017-12-08 NOTE — Telephone Encounter (Signed)
Gayle with [facility] states patient told them that we were supposed to be sending orders over. Edd Fabian is not sure what patient was referring to or what orders were for but if you need to contact them or send them orders: Phone # [phone] Fax # [phone]

## 2017-12-08 NOTE — Telephone Encounter (Signed)
This pt is s/p ankle fusion was in the office yesterday. Were there any changes to the pt's physical therapy order you would like me to call? The pt had advised therapy that we were going to be sending in orders?

## 2017-12-08 NOTE — Telephone Encounter (Signed)
Patient may be WBAT in fracture boot and walk with walker or crutches with Physical therapy.

## 2017-12-08 NOTE — Progress Notes (Signed)
Office Visit Note   Patient: [name]           Date of Birth: [birth year]           MRN: [id number] Visit Date: 12/07/2017              Requested by: Galen Manila, [facility] [place] [address] PCP: Galen Manila, MD  Chief Complaint  Patient presents with  . Left Ankle - Routine Post Op    11/01/17 left subtalar and talonavicular fusion       HPI: Patient is a 67 year old female who seen for postoperative follow-up following left subtalar and talonavicular fusion on 11/01/2017.  She is been wearing her silver compression stocking and nonweightbearing in her fracture boot.  She has more pain at bedtime and we discussed trying to elevate several times during the day as she is likely getting more edema and having more pain at night secondary to this.  She is otherwise feels she is doing well.  Assessment & Plan: Visit Diagnoses:  1. Status post ankle fusion     Plan: Counseled that patient can begin weightbearing as tolerated in her fracture boot.  She is to walk with a walker or crutches at first.  Counseled that if she has pain with weightbearing that she should back off and more gradually progress with her weightbearing status.  She is also instructed to use [name] butter or cocoa butter to her incisional areas and massage the scars.  Continue compression stocking.  She is can a follow-up in 3 weeks with radiographs at that time or sooner should she have difficulties in the interim.  Follow-Up Instructions: Return in about 3 weeks (around 12/28/2017).   Ortho Exam  Patient is alert, oriented, no adenopathy, well-dressed, normal affect, normal respiratory effort. The left ankle incisions are healing well and without signs of cellulitis or infection.  She does have mild localized edema but overall her edema is well controlled with silver compression stocking.  She does have some dry skin and darkened skin secondary to dryness and we discussed that she  can use [name] butter cocoa butter to these areas.  She can wash and get everything wet with soap and water in the shower.  Imaging: No results found. No images are attached to the encounter.  Labs: Lab Results  Component Value Date   HGBA1C 6.7 (H) 10/24/2017   HGBA1C  04/30/2008    6.0 (NOTE)   The ADA recommends the following therapeutic goal for glycemic   control related to Hgb A1C measurement:   Goal of Therapy:   < 7.0% Hgb A1C   Reference: American Diabetes Association: Clinical Practice   Recommendations 2008, Diabetes Care,  2008, 31:(Suppl 1).   REPTSTATUS 05/04/2008 FINAL 05/03/2008   GRAMSTAIN  04/29/2008    ABUNDANT WBC PRESENT,BOTH PMN AND MONONUCLEAR NO ORGANISMS SEEN Gram Stain Report Called to,Read Back By and Verified With: Gram Stain Report Called to,Read Back By and Verified With: JOSH RN [id number] ON 04/29/08 BY WILSONM Performed at [facility]  04/29/2008    ABUNDANT WBC PRESENT,BOTH PMN AND MONONUCLEAR NO ORGANISMS SEEN Gram Stain Report Called to,Read Back By and Verified With: Gram Stain Report Called to,Read Back By and Verified With: JOSH RN [id number] ON 04/29/08 Performed at [facility]  04/29/2008    ABUNDANT WBC PRESENT,BOTH PMN AND MONONUCLEAR NO ORGANISMS SEEN Gram Stain Report Called to,Read Back By and Verified  With: Josh RN 11:05 04/29/08 ([email or web address])   CULT YEAST 05/03/2008     Lab Results  Component Value Date   ALBUMIN 2.3 (L) 05/22/2008   ALBUMIN 2.3 (L) 05/21/2008   ALBUMIN 2.3 (L) 05/19/2008   PREALBUMIN 13.1 (L) 05/18/2008    Body mass index is 48.92 kg/m.  Orders:  Orders Placed This Encounter  Procedures  . XR Ankle 2 Views Left   Meds ordered this encounter  Medications  . oxyCODONE-acetaminophen (PERCOCET/ROXICET) 5-325 MG tablet    Sig: Take 1 tablet by mouth every 4 (four) hours as needed for moderate pain or severe pain.    Dispense:  40 tablet    Refill:  0     Procedures: No procedures  performed  Clinical Data: No additional findings.  ROS:  All other systems negative, except as noted in the HPI. Review of Systems  Objective: Vital Signs: Ht 5\' 5"  (1.651 m)   Wt 294 lb (133.4 kg)   BMI 48.92 kg/m   Specialty Comments:  No specialty comments available.  PMFS History: Patient Active Problem List   Diagnosis Date Noted  . Posterior tibial tendinitis of left leg 11/01/2017  . Posterior tibial tendinitis, left leg 10/02/2017  . Planovalgus deformity of foot, acquired, left 10/02/2017   Past Medical History:  Diagnosis Date  . Arthritis   . Bowel obstruction ([place])   . Cancer ([place]) 2015   Breast-right  . Diabetes mellitus without complication ([place])   . Fibromyalgia   . GERD (gastroesophageal reflux disease)   . Hypertension   . Presence of IVC filter 2010    History reviewed. No pertinent family history.  Past Surgical History:  Procedure Laterality Date  . ABDOMINAL HYSTERECTOMY    . ANKLE FUSION Left 11/01/2017   Procedure: SUBTALAR AND TALONAVICULAR FUSION LEFT FOOT;  Surgeon: Newt Minion, MD;  Location: [facility];  Service: Orthopedics;  Laterality: Left;  . BACK SURGERY  2007, 2008, 2010   x3   Social History   Occupational History  . Not on file  Tobacco Use  . Smoking status: Never Smoker  . Smokeless tobacco: Never Used  Substance and Sexual Activity  . Alcohol use: No  . Drug use: No  . Sexual activity: Not on file

## 2017-12-08 NOTE — Telephone Encounter (Signed)
I called and lm to advise HHPT that order has been faxed. Pt may be wtb in fx boot on left with crutches or walker. I tried to call the pt x 3 but there is a busy signal.

## 2017-12-28 ENCOUNTER — Ambulatory Visit (INDEPENDENT_AMBULATORY_CARE_PROVIDER_SITE_OTHER): Payer: Medicare Other

## 2017-12-28 ENCOUNTER — Ambulatory Visit (INDEPENDENT_AMBULATORY_CARE_PROVIDER_SITE_OTHER): Payer: Medicare Other | Admitting: Physician Assistant

## 2017-12-28 ENCOUNTER — Encounter (INDEPENDENT_AMBULATORY_CARE_PROVIDER_SITE_OTHER): Payer: Self-pay | Admitting: Orthopedic Surgery

## 2017-12-28 VITALS — Ht 65.0 in | Wt 294.0 lb

## 2017-12-28 DIAGNOSIS — Z981 Arthrodesis status: Secondary | ICD-10-CM

## 2017-12-28 MED ORDER — OXYCODONE-ACETAMINOPHEN 5-325 MG PO TABS: 1.0000 | ORAL_TABLET | ORAL | 0 refills | Status: DC | PRN

## 2017-12-29 ENCOUNTER — Encounter (INDEPENDENT_AMBULATORY_CARE_PROVIDER_SITE_OTHER): Payer: Self-pay | Admitting: Physician Assistant

## 2017-12-29 NOTE — Progress Notes (Signed)
Office Visit Note   Patient: [name]           Date of Birth: August 27, 1950           MRN: [id number] Visit Date: 12/28/2017              Requested by: Galen Manila, [facility] [place] [address] PCP: Galen Manila, MD  Chief Complaint  Patient presents with  . Left Leg - Follow-up  . Left Foot - Follow-up      HPI: The patient is a 67 year old female here with her husband for postoperative follow-up following left subtalar and talonavicular fusion on 11/01/2017.  She continues to utilize her silver compression stocking for edema control and has been weightbearing as tolerated in her fracture boot.  She reports some minimal swelling over the anterior foot and some pain at nighttime when she is been up and around but overall she is very pleased with her progress.  Assessment & Plan: Visit Diagnoses:  1. Status post ankle fusion     Plan: She has been advised to progress to a regular stiff soled shoe and that she can progressively begin to weight-bear as tolerated in the household initially and then outside the household if she is doing well in a regular shoe.  Percocet refilled this visit we will plan to see her back in 4 weeks with follow-up x-rays at that time.  Follow-Up Instructions: Return in about 4 weeks (around 01/25/2018).   Ortho Exam  Patient is alert, oriented, no adenopathy, well-dressed, normal affect, normal respiratory effort. The left ankle incisions are healing well and without signs of infection or cellulitis.  Patient was instructed in scar massage.  She does have some localized edema to the ankle area but overall her edema is markedly improved.  The foot appears straight without valgus deformity to the forefoot.  There is no pain with range of motion of the ankle.  Imaging: No results found. No images are attached to the encounter.  Labs: Lab Results  Component Value Date   HGBA1C 6.7 (H) 10/24/2017   HGBA1C  04/30/2008      6.0 (NOTE)   The ADA recommends the following therapeutic goal for glycemic   control related to Hgb A1C measurement:   Goal of Therapy:   < 7.0% Hgb A1C   Reference: American Diabetes Association: Clinical Practice   Recommendations 2008, Diabetes Care,  2008, 31:(Suppl 1).   REPTSTATUS 05/04/2008 FINAL 05/03/2008   GRAMSTAIN  04/29/2008    ABUNDANT WBC PRESENT,BOTH PMN AND MONONUCLEAR NO ORGANISMS SEEN Gram Stain Report Called to,Read Back By and Verified With: Gram Stain Report Called to,Read Back By and Verified With: JOSH RN [id number] ON 04/29/08 BY WILSONM Performed at [facility]  04/29/2008    ABUNDANT WBC PRESENT,BOTH PMN AND MONONUCLEAR NO ORGANISMS SEEN Gram Stain Report Called to,Read Back By and Verified With: Gram Stain Report Called to,Read Back By and Verified With: JOSH RN [id number] ON 04/29/08 Performed at [facility]  04/29/2008    ABUNDANT WBC PRESENT,BOTH PMN AND MONONUCLEAR NO ORGANISMS SEEN Gram Stain Report Called to,Read Back By and Verified With: Josh RN 11:05 04/29/08 ([email or web address])   CULT YEAST 05/03/2008     Lab Results  Component Value Date   ALBUMIN 2.3 (L) 05/22/2008   ALBUMIN 2.3 (L) 05/21/2008   ALBUMIN 2.3 (L) 05/19/2008   PREALBUMIN 13.1 (L) 05/18/2008    Body mass index  is 48.92 kg/m.  Orders:  Orders Placed This Encounter  Procedures  . XR Foot Complete Left   Meds ordered this encounter  Medications  . oxyCODONE-acetaminophen (PERCOCET/ROXICET) 5-325 MG tablet    Sig: Take 1 tablet by mouth every 4 (four) hours as needed for moderate pain or severe pain.    Dispense:  40 tablet    Refill:  0     Procedures: No procedures performed  Clinical Data: No additional findings.  ROS:  All other systems negative, except as noted in the HPI. Review of Systems  Objective: Vital Signs: Ht 5\' 5"  (1.651 m)   Wt 294 lb (133.4 kg)   BMI 48.92 kg/m   Specialty Comments:  No specialty comments  available.  PMFS History: Patient Active Problem List   Diagnosis Date Noted  . Posterior tibial tendinitis of left leg 11/01/2017  . Posterior tibial tendinitis, left leg 10/02/2017  . Planovalgus deformity of foot, acquired, left 10/02/2017   Past Medical History:  Diagnosis Date  . Arthritis   . Bowel obstruction ([place])   . Cancer ([place]) 2015   Breast-right  . Diabetes mellitus without complication ([place])   . Fibromyalgia   . GERD (gastroesophageal reflux disease)   . Hypertension   . Presence of IVC filter 2010    History reviewed. No pertinent family history.  Past Surgical History:  Procedure Laterality Date  . ABDOMINAL HYSTERECTOMY    . ANKLE FUSION Left 11/01/2017   Procedure: SUBTALAR AND TALONAVICULAR FUSION LEFT FOOT;  Surgeon: Newt Minion, MD;  Location: [facility];  Service: Orthopedics;  Laterality: Left;  . BACK SURGERY  2007, 2008, 2010   x3   Social History   Occupational History  . Not on file  Tobacco Use  . Smoking status: Never Smoker  . Smokeless tobacco: Never Used  Substance and Sexual Activity  . Alcohol use: No  . Drug use: No  . Sexual activity: Not on file

## 2018-01-10 ENCOUNTER — Telehealth (INDEPENDENT_AMBULATORY_CARE_PROVIDER_SITE_OTHER): Payer: Self-pay | Admitting: Orthopedic Surgery

## 2018-01-10 NOTE — Telephone Encounter (Signed)
Pt has appt with you tomorrow. Asking for refill on pain medication. Last refill was Percocet 06/3023 #40 12/28/17. Tried to pull up data base search and could not pull up result. She lives in [place]. Just an FYI to you for her appt tomorrow.

## 2018-01-10 NOTE — Telephone Encounter (Signed)
Patient called requesting refill of Oxycodone.  Please call patient t advise. Patient has appt tomorrow 01/11/18 w/James

## 2018-01-11 ENCOUNTER — Ambulatory Visit (INDEPENDENT_AMBULATORY_CARE_PROVIDER_SITE_OTHER): Payer: Medicare Other | Admitting: Surgery

## 2018-01-11 ENCOUNTER — Encounter (INDEPENDENT_AMBULATORY_CARE_PROVIDER_SITE_OTHER): Payer: Self-pay | Admitting: Surgery

## 2018-01-11 ENCOUNTER — Other Ambulatory Visit (INDEPENDENT_AMBULATORY_CARE_PROVIDER_SITE_OTHER): Payer: Self-pay

## 2018-01-11 ENCOUNTER — Ambulatory Visit (INDEPENDENT_AMBULATORY_CARE_PROVIDER_SITE_OTHER): Payer: Self-pay

## 2018-01-11 DIAGNOSIS — G8929 Other chronic pain: Secondary | ICD-10-CM | POA: Diagnosis not present

## 2018-01-11 DIAGNOSIS — M25561 Pain in right knee: Secondary | ICD-10-CM

## 2018-01-11 DIAGNOSIS — M25562 Pain in left knee: Secondary | ICD-10-CM

## 2018-01-11 DIAGNOSIS — M1712 Unilateral primary osteoarthritis, left knee: Secondary | ICD-10-CM | POA: Diagnosis not present

## 2018-01-11 DIAGNOSIS — M1711 Unilateral primary osteoarthritis, right knee: Secondary | ICD-10-CM

## 2018-01-11 MED ORDER — OXYCODONE-ACETAMINOPHEN 5-325 MG PO TABS: 1.0000 | ORAL_TABLET | ORAL | 0 refills | Status: DC | PRN

## 2018-01-11 NOTE — Progress Notes (Signed)
Office Visit Note   Patient: [name]           Date of Birth: Jul 14, 1950           MRN: [id number] Visit Date: 01/11/2018              Requested by: Galen Manila, [place] [address] PCP: Galen Manila, MD   Assessment & Plan: Visit Diagnoses:  1. Chronic pain of left knee   2. Chronic pain of right knee   3. Unilateral primary osteoarthritis, left knee   4. Unilateral primary osteoarthritis, right knee     Plan: Today I agreed to perform bilateral knee Marcaine/Depo-Medrol injections.  Patient sent bile knees were prepped with Betadine and injections were performed.  Tolerated without complication.  I did review bilateral knee x-rays with patient and husband who was present.  She definitely does have progressive changes now in the right patellofemoral compartment.  I think patient would greatly benefit from right total knee replacement.  We did discuss weight loss but I think we could have good access to patient's knee for procedure.  I think patient is also motivated to get better with the rehab that would be involved.  She will follow-up in 6 weeks with Dr. Louanne Skye for further discussion.  Follow-Up Instructions: Return in about 6 weeks (around 02/22/2018) for With Dr. Louanne Skye only to discuss possible right total knee replacement.   Orders:  Orders Placed This Encounter  Procedures  . Large Joint Inj  . XR KNEE 3 VIEW RIGHT  . XR KNEE 3 VIEW LEFT   No orders of the defined types were placed in this encounter.     Procedures: Large Joint Inj: bilateral knee on 01/11/2018 9:38 AM Indications: pain and joint swelling Details: 25 G 1.5 in needle, anteromedial approach Medications (Right): 6 mL bupivacaine 0.25 %; 3 mL lidocaine 1 %; 40 mg methylPREDNISolone acetate 40 MG/ML Medications (Left): 6 mL bupivacaine 0.25 %; 3 mL lidocaine 1 %; 40 mg methylPREDNISolone acetate 40 MG/ML Outcome: tolerated well, no immediate  complications Consent was given by the patient. Patient was prepped and draped in the usual sterile fashion.       Clinical Data: No additional findings.   Subjective: Chief Complaint  Patient presents with  . Left Knee - Pain  . Right Knee - Pain    HPI 67 year old white female comes in with complaints of bilateral knee pain.  She has known history of bilateral knee DJD.  Pain in both knees with all activity.  She understands at some point will come down her needing total knee replacement. Review of Systems No current cardiac pulmonary GI GU issues  Objective: Vital Signs: There were no vitals taken for this visit.  Physical Exam HENT:     Head: Normocephalic and atraumatic.  Eyes:     Extraocular Movements: Extraocular movements intact.     Pupils: Pupils are equal, round, and reactive to light.  Pulmonary:     Effort: No respiratory distress.  Musculoskeletal:        General: Swelling and tenderness present.     Comments: Bilateral knees  Neurological:     General: No focal deficit present.     Mental Status: She is alert and oriented to person, place, and time.  Psychiatric:        Mood and Affect: Mood normal.     Ortho Exam  Specialty Comments:  No specialty comments available.  Imaging:  No results found.   PMFS History: Patient Active Problem List   Diagnosis Date Noted  . Posterior tibial tendinitis of left leg 11/01/2017  . Posterior tibial tendinitis, left leg 10/02/2017  . Planovalgus deformity of foot, acquired, left 10/02/2017   Past Medical History:  Diagnosis Date  . Arthritis   . Bowel obstruction ([place])   . Cancer ([place]) 2015   Breast-right  . Diabetes mellitus without complication ([place])   . Fibromyalgia   . GERD (gastroesophageal reflux disease)   . Hypertension   . Presence of IVC filter 2010    History reviewed. No pertinent family history.  Past Surgical History:  Procedure Laterality Date  . ABDOMINAL HYSTERECTOMY    .  ANKLE FUSION Left 11/01/2017   Procedure: SUBTALAR AND TALONAVICULAR FUSION LEFT FOOT;  Surgeon: Newt Minion, MD;  Location: [facility];  Service: Orthopedics;  Laterality: Left;  . BACK SURGERY  2007, 2008, 2010   x3   Social History   Occupational History  . Not on file  Tobacco Use  . Smoking status: Never Smoker  . Smokeless tobacco: Never Used  Substance and Sexual Activity  . Alcohol use: No  . Drug use: No  . Sexual activity: Not on file

## 2018-01-25 ENCOUNTER — Ambulatory Visit (INDEPENDENT_AMBULATORY_CARE_PROVIDER_SITE_OTHER): Payer: Medicare Other | Admitting: Physician Assistant

## 2018-01-25 ENCOUNTER — Encounter (INDEPENDENT_AMBULATORY_CARE_PROVIDER_SITE_OTHER): Payer: Self-pay | Admitting: Orthopedic Surgery

## 2018-01-25 ENCOUNTER — Ambulatory Visit (INDEPENDENT_AMBULATORY_CARE_PROVIDER_SITE_OTHER): Payer: Medicare Other | Admitting: Surgery

## 2018-01-25 VITALS — Ht 65.0 in | Wt 294.0 lb

## 2018-01-25 DIAGNOSIS — Z981 Arthrodesis status: Secondary | ICD-10-CM

## 2018-01-25 DIAGNOSIS — M722 Plantar fascial fibromatosis: Secondary | ICD-10-CM

## 2018-01-25 MED ORDER — OXYCODONE-ACETAMINOPHEN 5-325 MG PO TABS: 1.0000 | ORAL_TABLET | Freq: Four times a day (QID) | ORAL | 0 refills | Status: AC | PRN

## 2018-01-29 ENCOUNTER — Encounter (INDEPENDENT_AMBULATORY_CARE_PROVIDER_SITE_OTHER): Payer: Self-pay | Admitting: Physician Assistant

## 2018-01-29 DIAGNOSIS — M722 Plantar fascial fibromatosis: Secondary | ICD-10-CM

## 2018-01-29 MED ORDER — BUPIVACAINE HCL 0.5 % IJ SOLN
1.0000 mL | INTRAMUSCULAR | Status: AC | PRN
  Administered 2018-01-29: 1 mL

## 2018-01-29 MED ORDER — LIDOCAINE HCL 1 % IJ SOLN
3.0000 mL | INTRAMUSCULAR | Status: AC | PRN
  Administered 2018-01-29: 3 mL

## 2018-01-29 MED ORDER — METHYLPREDNISOLONE ACETATE 40 MG/ML IJ SUSP
13.3300 mg | INTRAMUSCULAR | Status: AC | PRN
  Administered 2018-01-29: 13.33 mg

## 2018-01-29 NOTE — Progress Notes (Signed)
Office Visit Note   Patient: [name]           Date of Birth: 01/19/1951           MRN: [id number] Visit Date: 01/25/2018              Requested by: Galen Manila, [facility] [place] [address] PCP: Galen Manila, MD  Chief Complaint  Patient presents with  . Left Knee - Follow-up    S/p bilat knee injections 01/11/18  . Right Knee - Follow-up      HPI: The patient is a 67 year old woman here with her husband for follow-up of her left ankle with a history of left subtalar and talonavicular fusion on 11/01/2017.  She is wearing her silver compression stocking and has been gradually weightbearing in a stiff soled shoe.  She reports over the past several days that she is developed pain in her left heel which is worsened to the point where even when resting the heel is bothering her.  She is ambulating with a walker.  She had bilateral knee injections due to complaints of bilateral knee pain and reports that these did help some.  This was several weeks ago.  She does report that she has been walking a lot more trying to [employer] shop and get around more and noticed progressive heel pain on the left.  Assessment & Plan: Visit Diagnoses:  1. Status post ankle fusion   2. Plantar fascial fibromatosis     Plan: After informed consent the left heel was injected with a combination of lidocaine and Depo-Medrol under sterile techniques and the patient tolerated this well.  Percocet was refilled.  Patient instructed that she may benefit from some inserts and she is going to find some inserts and continue to use her stiff soled shoes.  She will follow-up here in 4 weeks with x-rays at that time.  Follow-Up Instructions: Return in about 4 weeks (around 02/22/2018).   Ortho Exam  Patient is alert, oriented, no adenopathy, well-dressed, normal affect, normal respiratory effort. The patient's left ankle the incisions are well-healed.  She has some mild localized  edema to the ankle.  She has point tenderness over the insertion of the plantar fashion about the left heel.  After informed consent the plantar fascia was injected with lidocaine and Depo-Medrol under sterile techniques and the patient tolerated this well.  Imaging: No results found. No images are attached to the encounter.  Labs: Lab Results  Component Value Date   HGBA1C 6.7 (H) 10/24/2017   HGBA1C  04/30/2008    6.0 (NOTE)   The ADA recommends the following therapeutic goal for glycemic   control related to Hgb A1C measurement:   Goal of Therapy:   < 7.0% Hgb A1C   Reference: American Diabetes Association: Clinical Practice   Recommendations 2008, Diabetes Care,  2008, 31:(Suppl 1).   REPTSTATUS 05/04/2008 FINAL 05/03/2008   GRAMSTAIN  04/29/2008    ABUNDANT WBC PRESENT,BOTH PMN AND MONONUCLEAR NO ORGANISMS SEEN Gram Stain Report Called to,Read Back By and Verified With: Gram Stain Report Called to,Read Back By and Verified With: JOSH RN [id number] ON 04/29/08 BY WILSONM Performed at [facility]  04/29/2008    ABUNDANT WBC PRESENT,BOTH PMN AND MONONUCLEAR NO ORGANISMS SEEN Gram Stain Report Called to,Read Back By and Verified With: Gram Stain Report Called to,Read Back By and Verified With: JOSH RN 1740 ON 04/29/08 Performed at [facility]  GRAMSTAIN  04/29/2008    ABUNDANT WBC PRESENT,BOTH PMN AND MONONUCLEAR NO ORGANISMS SEEN Gram Stain Report Called to,Read Back By and Verified With: Josh RN 11:05 04/29/08 ([email or web address])   CULT YEAST 05/03/2008     Lab Results  Component Value Date   ALBUMIN 2.3 (L) 05/22/2008   ALBUMIN 2.3 (L) 05/21/2008   ALBUMIN 2.3 (L) 05/19/2008   PREALBUMIN 13.1 (L) 05/18/2008    Body mass index is 48.92 kg/m.  Orders:  No orders of the defined types were placed in this encounter.  Meds ordered this encounter  Medications  . oxyCODONE-acetaminophen (PERCOCET/ROXICET) 5-325 MG tablet    Sig: Take 1 tablet by mouth every 6  (six) hours as needed for moderate pain or severe pain.    Dispense:  28 tablet    Refill:  0     Procedures: Foot Inj Date/Time: 01/29/2018 7:44 AM Performed by: Milas Gain, PA-C Authorized by: Milas Gain, PA-C   Consent Given by:  Patient Site marked: the procedure site was marked   Timeout: prior to procedure the correct patient, procedure, and site was verified   Indications:  Fasciitis and pain Condition: Plantar Fasciitis   Location: left plantar fascia muscle   Prep: patient was prepped and draped in usual sterile fashion   Needle Size:  22 G Medications:  3 mL lidocaine 1 %; 1 mL bupivacaine 0.5 %; 13.33 mg methylPREDNISolone acetate 40 MG/ML Patient Tolerance:  Patient tolerated the procedure well with no immediate complications    Clinical Data: No additional findings.  ROS:  All other systems negative, except as noted in the HPI. Review of Systems  Objective: Vital Signs: Ht 5\' 5"  (1.651 m)   Wt 294 lb (133.4 kg)   BMI 48.92 kg/m   Specialty Comments:  No specialty comments available.  PMFS History: Patient Active Problem List   Diagnosis Date Noted  . Posterior tibial tendinitis of left leg 11/01/2017  . Posterior tibial tendinitis, left leg 10/02/2017  . Planovalgus deformity of foot, acquired, left 10/02/2017   Past Medical History:  Diagnosis Date  . Arthritis   . Bowel obstruction ([place])   . Cancer ([place]) 2015   Breast-right  . Diabetes mellitus without complication ([place])   . Fibromyalgia   . GERD (gastroesophageal reflux disease)   . Hypertension   . Presence of IVC filter 2010    History reviewed. No pertinent family history.  Past Surgical History:  Procedure Laterality Date  . ABDOMINAL HYSTERECTOMY    . ANKLE FUSION Left 11/01/2017   Procedure: SUBTALAR AND TALONAVICULAR FUSION LEFT FOOT;  Surgeon: Newt Minion, MD;  Location: [facility];  Service: Orthopedics;  Laterality: Left;  . BACK SURGERY  2007,  2008, 2010   x3   Social History   Occupational History  . Not on file  Tobacco Use  . Smoking status: Never Smoker  . Smokeless tobacco: Never Used  Substance and Sexual Activity  . Alcohol use: No  . Drug use: No  . Sexual activity: Not on file

## 2018-02-19 ENCOUNTER — Encounter (INDEPENDENT_AMBULATORY_CARE_PROVIDER_SITE_OTHER): Payer: Self-pay | Admitting: Orthopedic Surgery

## 2018-02-19 ENCOUNTER — Ambulatory Visit (INDEPENDENT_AMBULATORY_CARE_PROVIDER_SITE_OTHER): Payer: Self-pay

## 2018-02-19 ENCOUNTER — Ambulatory Visit (INDEPENDENT_AMBULATORY_CARE_PROVIDER_SITE_OTHER): Payer: Medicare Other | Admitting: Orthopedic Surgery

## 2018-02-19 VITALS — Ht 65.0 in | Wt 294.0 lb

## 2018-02-19 DIAGNOSIS — M174 Other bilateral secondary osteoarthritis of knee: Secondary | ICD-10-CM

## 2018-02-19 DIAGNOSIS — M7061 Trochanteric bursitis, right hip: Secondary | ICD-10-CM | POA: Diagnosis not present

## 2018-02-19 DIAGNOSIS — M7582 Other shoulder lesions, left shoulder: Secondary | ICD-10-CM | POA: Diagnosis not present

## 2018-02-19 DIAGNOSIS — Z981 Arthrodesis status: Secondary | ICD-10-CM | POA: Diagnosis not present

## 2018-02-19 DIAGNOSIS — M76822 Posterior tibial tendinitis, left leg: Secondary | ICD-10-CM

## 2018-02-19 DIAGNOSIS — G5603 Carpal tunnel syndrome, bilateral upper limbs: Secondary | ICD-10-CM

## 2018-02-19 DIAGNOSIS — M25872 Other specified joint disorders, left ankle and foot: Secondary | ICD-10-CM

## 2018-02-19 DIAGNOSIS — M7062 Trochanteric bursitis, left hip: Secondary | ICD-10-CM

## 2018-02-19 DIAGNOSIS — M778 Other enthesopathies, not elsewhere classified: Secondary | ICD-10-CM

## 2018-02-19 MED ORDER — METHYLPREDNISOLONE ACETATE 40 MG/ML IJ SUSP
40.0000 mg | INTRAMUSCULAR | Status: AC | PRN
  Administered 2018-02-19: 40 mg via INTRA_ARTICULAR

## 2018-02-19 MED ORDER — TRAMADOL HCL 50 MG PO TABS: 100.0000 mg | ORAL_TABLET | Freq: Four times a day (QID) | ORAL | 0 refills | Status: DC | PRN

## 2018-02-19 MED ORDER — LIDOCAINE HCL 1 % IJ SOLN
2.0000 mL | INTRAMUSCULAR | Status: AC | PRN
  Administered 2018-02-19: 2 mL

## 2018-02-19 NOTE — Progress Notes (Signed)
Office Visit Note   Patient: [name]           Date of Birth: May 03, 1950           MRN: [id number] Visit Date: 02/19/2018              Requested by: Galen Manila, [facility] [place] [address] PCP: Galen Manila, MD  Chief Complaint  Patient presents with  . Left Ankle - Pain, Follow-up      HPI: Patient is a 68 year old woman who presents she is status post subtalar and talonavicular fusion over 3 months ago.  Patient states she has been having plantar fascia pain she plans to the origin the plantar fascia she states that previous injection of this area did not provide her any change in her symptoms.  She states she feels better ambulating with her fracture boot.  Assessment & Plan: Visit Diagnoses:  1. Status post ankle fusion   2. Greater trochanteric bursitis, right   3. Shoulder tendonitis, left   4. Carpal tunnel syndrome, bilateral   5. Other bilateral secondary osteoarthritis of knee   6. Trochanteric bursitis, left hip   7. Posterior tibial tendinitis, left leg   8. Impingement of left ankle joint     Plan: The ankle was injected she tolerated this well she may increase her activities as tolerated okay to proceed with using a treadmill machine.  Follow-Up Instructions: Return in about 4 weeks (around 03/19/2018).   Ortho Exam  Patient is alert, oriented, no adenopathy, well-dressed, normal affect, normal respiratory effort. Examination patient has a palpable pulse.  She has good dorsiflexion of the ankle.  The Achilles and plantar fascial are not tender to palpation she does have atrophy of the fat pad.  She is exquisitely tender to palpation anteriorly over the ankle.  Maximal dorsiflexion reproduces the ankle pain.  Ankle was injected without complications.  Imaging: No results found. No images are attached to the encounter.  Labs: Lab Results  Component Value Date   HGBA1C 6.7 (H) 10/24/2017   HGBA1C  04/30/2008     6.0 (NOTE)   The ADA recommends the following therapeutic goal for glycemic   control related to Hgb A1C measurement:   Goal of Therapy:   < 7.0% Hgb A1C   Reference: American Diabetes Association: Clinical Practice   Recommendations 2008, Diabetes Care,  2008, 31:(Suppl 1).   REPTSTATUS 05/04/2008 FINAL 05/03/2008   GRAMSTAIN  04/29/2008    ABUNDANT WBC PRESENT,BOTH PMN AND MONONUCLEAR NO ORGANISMS SEEN Gram Stain Report Called to,Read Back By and Verified With: Gram Stain Report Called to,Read Back By and Verified With: JOSH RN 1914 ON 04/29/08 BY WILSONM Performed at [facility]  04/29/2008    ABUNDANT WBC PRESENT,BOTH PMN AND MONONUCLEAR NO ORGANISMS SEEN Gram Stain Report Called to,Read Back By and Verified With: Gram Stain Report Called to,Read Back By and Verified With: JOSH RN [id number] ON 04/29/08 Performed at [facility]  04/29/2008    ABUNDANT WBC PRESENT,BOTH PMN AND MONONUCLEAR NO ORGANISMS SEEN Gram Stain Report Called to,Read Back By and Verified With: Josh RN 11:05 04/29/08 ([email or web address])   CULT YEAST 05/03/2008     Lab Results  Component Value Date   ALBUMIN 2.3 (L) 05/22/2008   ALBUMIN 2.3 (L) 05/21/2008   ALBUMIN 2.3 (L) 05/19/2008   PREALBUMIN 13.1 (L) 05/18/2008    Body mass index is 48.92 kg/m.  Orders:  No orders of the defined types were placed in this encounter.  Meds ordered this encounter  Medications  . traMADol (ULTRAM) 50 MG tablet    Sig: Take 2 tablets (100 mg total) by mouth every 6 (six) hours as needed for moderate pain.    Dispense:  30 tablet    Refill:  0     Procedures: Medium Joint Inj: L ankle on 02/19/2018 10:54 AM Indications: pain and diagnostic evaluation Details: 22 G 1.5 in needle, anteromedial approach Medications: 2 mL lidocaine 1 %; 40 mg methylPREDNISolone acetate 40 MG/ML Outcome: tolerated well, no immediate complications Procedure, treatment alternatives, risks and benefits explained,  specific risks discussed. Consent was given by the patient. Immediately prior to procedure a time out was called to verify the correct patient, procedure, equipment, support staff and site/side marked as required. Patient was prepped and draped in the usual sterile fashion.      Clinical Data: No additional findings.  ROS:  All other systems negative, except as noted in the HPI. Review of Systems  Objective: Vital Signs: Ht 5\' 5"  (1.651 m)   Wt 294 lb (133.4 kg)   BMI 48.92 kg/m   Specialty Comments:  No specialty comments available.  PMFS History: Patient Active Problem List   Diagnosis Date Noted  . Posterior tibial tendinitis of left leg 11/01/2017  . Posterior tibial tendinitis, left leg 10/02/2017  . Planovalgus deformity of foot, acquired, left 10/02/2017   Past Medical History:  Diagnosis Date  . Arthritis   . Bowel obstruction ([place])   . Cancer ([place]) 2015   Breast-right  . Diabetes mellitus without complication ([place])   . Fibromyalgia   . GERD (gastroesophageal reflux disease)   . Hypertension   . Presence of IVC filter 2010    History reviewed. No pertinent family history.  Past Surgical History:  Procedure Laterality Date  . ABDOMINAL HYSTERECTOMY    . ANKLE FUSION Left 11/01/2017   Procedure: SUBTALAR AND TALONAVICULAR FUSION LEFT FOOT;  Surgeon: Newt Minion, MD;  Location: [facility];  Service: Orthopedics;  Laterality: Left;  . BACK SURGERY  2007, 2008, 2010   x3   Social History   Occupational History  . Not on file  Tobacco Use  . Smoking status: Never Smoker  . Smokeless tobacco: Never Used  Substance and Sexual Activity  . Alcohol use: No  . Drug use: No  . Sexual activity: Not on file

## 2018-02-22 ENCOUNTER — Ambulatory Visit (INDEPENDENT_AMBULATORY_CARE_PROVIDER_SITE_OTHER): Payer: Medicare Other | Admitting: Specialist

## 2018-03-20 ENCOUNTER — Telehealth (INDEPENDENT_AMBULATORY_CARE_PROVIDER_SITE_OTHER): Payer: Self-pay | Admitting: Specialist

## 2018-03-20 DIAGNOSIS — G5603 Carpal tunnel syndrome, bilateral upper limbs: Secondary | ICD-10-CM

## 2018-03-20 DIAGNOSIS — M7582 Other shoulder lesions, left shoulder: Secondary | ICD-10-CM

## 2018-03-20 DIAGNOSIS — M7061 Trochanteric bursitis, right hip: Secondary | ICD-10-CM

## 2018-03-20 DIAGNOSIS — M7062 Trochanteric bursitis, left hip: Secondary | ICD-10-CM

## 2018-03-20 DIAGNOSIS — M778 Other enthesopathies, not elsewhere classified: Secondary | ICD-10-CM

## 2018-03-20 DIAGNOSIS — M174 Other bilateral secondary osteoarthritis of knee: Secondary | ICD-10-CM

## 2018-03-20 NOTE — Telephone Encounter (Signed)
Pt Called requesting medication refill for tramadol

## 2018-03-22 ENCOUNTER — Ambulatory Visit (INDEPENDENT_AMBULATORY_CARE_PROVIDER_SITE_OTHER): Payer: Medicare Other | Admitting: Orthopedic Surgery

## 2018-03-22 ENCOUNTER — Encounter (INDEPENDENT_AMBULATORY_CARE_PROVIDER_SITE_OTHER): Payer: Self-pay | Admitting: Orthopedic Surgery

## 2018-03-22 VITALS — Ht 65.0 in | Wt 294.0 lb

## 2018-03-22 DIAGNOSIS — M170 Bilateral primary osteoarthritis of knee: Secondary | ICD-10-CM

## 2018-03-22 DIAGNOSIS — Z981 Arthrodesis status: Secondary | ICD-10-CM

## 2018-03-22 MED ORDER — TRAMADOL HCL 50 MG PO TABS: 50.0000 mg | ORAL_TABLET | Freq: Four times a day (QID) | ORAL | 0 refills | Status: DC | PRN

## 2018-03-22 NOTE — Progress Notes (Signed)
Office Visit Note   Patient: [name]           Date of Birth: 02-20-1950           MRN: [id number] Visit Date: 03/22/2018              Requested by: Galen Manila, [facility] [place] [address] PCP: Galen Manila, MD  Chief Complaint  Patient presents with  . Left Ankle - Follow-up      HPI: Patient is a 68 year old woman who presents for 2 separate issues #1 she is 4-1/2 months out from subtalar and talonavicular fusion she is status post an ankle injection which she states helped a lot with her impingement symptoms she states she still has some swelling in the foot and ankle but overall feels well.  Patient complains of pain in both knees she currently uses a Rollator walker.  Assessment & Plan: Visit Diagnoses:  1. Status post ankle fusion   2. Bilateral primary osteoarthritis of knee     Plan: Patient was given a refill prescription for her Ultram recommended continue with strength and weight loss program for consideration of total knee arthroplasty.  She may wean off the walker onto a cane as she feels comfortable.  Follow-Up Instructions: Return if symptoms worsen or fail to improve.   Ortho Exam  Patient is alert, oriented, no adenopathy, well-dressed, normal affect, normal respiratory effort. Examination patient ambulates with a rolling walker.  The left ankle has no pain with passive range of motion she does have some swelling in the foot and ankle on the left her foot is plantigrade no subtalar or talonavicular pain with weightbearing.  Ankle is asymptomatic today.  Examination she does have crepitation and pain with range of motion of both knees collaterals and cruciates are stable.  Imaging: No results found. No images are attached to the encounter.  Labs: Lab Results  Component Value Date   HGBA1C 6.7 (H) 10/24/2017   HGBA1C  04/30/2008    6.0 (NOTE)   The ADA recommends the following therapeutic goal for glycemic   control  related to Hgb A1C measurement:   Goal of Therapy:   < 7.0% Hgb A1C   Reference: American Diabetes Association: Clinical Practice   Recommendations 2008, Diabetes Care,  2008, 31:(Suppl 1).   REPTSTATUS 05/04/2008 FINAL 05/03/2008   GRAMSTAIN  04/29/2008    ABUNDANT WBC PRESENT,BOTH PMN AND MONONUCLEAR NO ORGANISMS SEEN Gram Stain Report Called to,Read Back By and Verified With: Gram Stain Report Called to,Read Back By and Verified With: JOSH RN 4315 ON 04/29/08 BY WILSONM Performed at [facility]  04/29/2008    ABUNDANT WBC PRESENT,BOTH PMN AND MONONUCLEAR NO ORGANISMS SEEN Gram Stain Report Called to,Read Back By and Verified With: Gram Stain Report Called to,Read Back By and Verified With: JOSH RN 4008 ON 04/29/08 Performed at [facility]  04/29/2008    ABUNDANT WBC PRESENT,BOTH PMN AND MONONUCLEAR NO ORGANISMS SEEN Gram Stain Report Called to,Read Back By and Verified With: Josh RN 11:05 04/29/08 ([email or web address])   CULT YEAST 05/03/2008     Lab Results  Component Value Date   ALBUMIN 2.3 (L) 05/22/2008   ALBUMIN 2.3 (L) 05/21/2008   ALBUMIN 2.3 (L) 05/19/2008   PREALBUMIN 13.1 (L) 05/18/2008    Body mass index is 48.92 kg/m.  Orders:  No orders of the defined types were placed in this encounter.  Meds ordered this  encounter  Medications  . traMADol (ULTRAM) 50 MG tablet    Sig: Take 1 tablet (50 mg total) by mouth every 6 (six) hours as needed for moderate pain.    Dispense:  30 tablet    Refill:  0     Procedures: No procedures performed  Clinical Data: No additional findings.  ROS:  All other systems negative, except as noted in the HPI. Review of Systems  Objective: Vital Signs: Ht 5\' 5"  (1.651 m)   Wt 294 lb (133.4 kg)   BMI 48.92 kg/m   Specialty Comments:  No specialty comments available.  PMFS History: Patient Active Problem List   Diagnosis Date Noted  . Bilateral primary osteoarthritis of knee 03/22/2018  .  Posterior tibial tendinitis of left leg 11/01/2017  . Posterior tibial tendinitis, left leg 10/02/2017  . Planovalgus deformity of foot, acquired, left 10/02/2017   Past Medical History:  Diagnosis Date  . Arthritis   . Bowel obstruction ([place])   . Cancer ([place]) 2015   Breast-right  . Diabetes mellitus without complication ([place])   . Fibromyalgia   . GERD (gastroesophageal reflux disease)   . Hypertension   . Presence of IVC filter 2010    History reviewed. No pertinent family history.  Past Surgical History:  Procedure Laterality Date  . ABDOMINAL HYSTERECTOMY    . ANKLE FUSION Left 11/01/2017   Procedure: SUBTALAR AND TALONAVICULAR FUSION LEFT FOOT;  Surgeon: Newt Minion, MD;  Location: [facility];  Service: Orthopedics;  Laterality: Left;  . BACK SURGERY  2007, 2008, 2010   x3   Social History   Occupational History  . Not on file  Tobacco Use  . Smoking status: Never Smoker  . Smokeless tobacco: Never Used  Substance and Sexual Activity  . Alcohol use: No  . Drug use: No  . Sexual activity: Not on file

## 2018-05-01 MED ORDER — BUPIVACAINE HCL 0.25 % IJ SOLN
6.0000 mL | INTRAMUSCULAR | Status: AC | PRN
  Administered 2018-01-11: 6 mL via INTRA_ARTICULAR

## 2018-05-01 MED ORDER — METHYLPREDNISOLONE ACETATE 40 MG/ML IJ SUSP
40.0000 mg | INTRAMUSCULAR | Status: AC | PRN
  Administered 2018-01-11: 40 mg via INTRA_ARTICULAR

## 2018-05-01 MED ORDER — LIDOCAINE HCL 1 % IJ SOLN
3.0000 mL | INTRAMUSCULAR | Status: AC | PRN
  Administered 2018-01-11: 3 mL

## 2018-05-25 ENCOUNTER — Other Ambulatory Visit (INDEPENDENT_AMBULATORY_CARE_PROVIDER_SITE_OTHER): Payer: Self-pay | Admitting: Specialist

## 2018-05-25 MED ORDER — TRAMADOL HCL 50 MG PO TABS: 50.0000 mg | ORAL_TABLET | Freq: Four times a day (QID) | ORAL | 0 refills | Status: DC | PRN

## 2018-05-25 NOTE — Telephone Encounter (Signed)
Patient called requesting an RX refill on her Tramadol.  Patient uses [facility] in [place], [place].  [phone].  Thank you.

## 2018-05-25 NOTE — Telephone Encounter (Signed)
Sent request to Dr. Louanne Skye for tramadol refill

## 2018-06-21 ENCOUNTER — Other Ambulatory Visit: Payer: Self-pay

## 2018-06-21 ENCOUNTER — Ambulatory Visit (INDEPENDENT_AMBULATORY_CARE_PROVIDER_SITE_OTHER): Payer: Medicare Other | Admitting: Specialist

## 2018-06-21 ENCOUNTER — Encounter: Payer: Self-pay | Admitting: Specialist

## 2018-06-21 VITALS — BP 90/51 | HR 98 | Ht 66.0 in | Wt 280.0 lb

## 2018-06-21 DIAGNOSIS — M1712 Unilateral primary osteoarthritis, left knee: Secondary | ICD-10-CM | POA: Diagnosis not present

## 2018-06-21 DIAGNOSIS — M1711 Unilateral primary osteoarthritis, right knee: Secondary | ICD-10-CM

## 2018-06-21 DIAGNOSIS — D469 Myelodysplastic syndrome, unspecified: Secondary | ICD-10-CM

## 2018-06-21 MED ORDER — METHYLPREDNISOLONE ACETATE 40 MG/ML IJ SUSP
40.0000 mg | INTRAMUSCULAR | Status: AC | PRN
  Administered 2018-06-21: 12:00:00 40 mg via INTRA_ARTICULAR

## 2018-06-21 MED ORDER — BUPIVACAINE HCL 0.25 % IJ SOLN
4.0000 mL | INTRAMUSCULAR | Status: AC | PRN
  Administered 2018-06-21: 12:00:00 4 mL via INTRA_ARTICULAR

## 2018-06-21 MED ORDER — BUPIVACAINE HCL 0.25 % IJ SOLN
4.0000 mL | INTRAMUSCULAR | Status: AC | PRN
  Administered 2018-06-21: 4 mL via INTRA_ARTICULAR

## 2018-06-21 MED ORDER — TRAMADOL HCL 50 MG PO TABS: 100.0000 mg | ORAL_TABLET | Freq: Four times a day (QID) | ORAL | 0 refills | Status: AC | PRN

## 2018-06-21 NOTE — Progress Notes (Signed)
Office Visit Note   Patient: [name]           Date of Birth: [birth year]           MRN: [id number] Visit Date: 06/21/2018              Requested by: Galen Manila, [place] [address] PCP: Galen Manila, MD   Assessment & Plan: Visit Diagnoses:  1. Unilateral primary osteoarthritis, right knee   2. Unilateral primary osteoarthritis, left knee   3. MDS (myelodysplastic syndrome) ([place])     Plan: The main ways of treat osteoarthritis, that are found to be success. Weight loss helps to decrease pain. Exercise is important to maintaining cartilage and thickness and strengthening. NSAIDs like tylenol are meds decreasing the inflamation. Ice is okay  In afternoon and evening and hot shower in the am.  Tramadol for pain is reasonable for more severe pain. Return appointment for 5 weeks. If pain is improved and you wish to wait for more significant pain to try further injections please call us and we will Change return to a later date.    Follow-Up Instructions: Return in about 5 weeks (around 07/26/2018).   Orders:  No orders of the defined types were placed in this encounter.  No orders of the defined types were placed in this encounter.     Procedures: Large Joint Inj: R knee on 06/21/2018 11:38 AM Indications: pain Details: 25 G 1.5 in needle, anteromedial approach  Arthrogram: No  Medications: 4 mL bupivacaine 0.25 %; 40 mg methylPREDNISolone acetate 40 MG/ML Outcome: tolerated well, no immediate complications  Right knee bandaid applied.  Procedure, treatment alternatives, risks and benefits explained, specific risks discussed. Consent was given by the patient. Immediately prior to procedure a time out was called to verify the correct patient, procedure, equipment, support staff and site/side marked as required. Patient was prepped and draped in the usual sterile fashion.   Large Joint Inj: L knee on 06/21/2018 11:38 AM  Indications: pain Details: 25 G 1.5 in needle, anterolateral approach  Arthrogram: No  Medications: 40 mg methylPREDNISolone acetate 40 MG/ML; 4 mL bupivacaine 0.25 % Outcome: tolerated well, no immediate complications  bandaid applied. Procedure, treatment alternatives, risks and benefits explained, specific risks discussed. Consent was given by the patient. Immediately prior to procedure a time out was called to verify the correct patient, procedure, equipment, support staff and site/side marked as required. Patient was prepped and draped in the usual sterile fashion.       Clinical Data: No additional findings.   Subjective: Chief Complaint  Patient presents with  . Right Knee - Pain  . Left Knee - Pain    68 year old female with history of left ankle fusion 10/2017 for severe arthritis and left foot posterior tibial tendon deficiency. She did well with the left foot  Surgery and she is happy the left foot and ankle pain is gone. Seen by Jeneen Rinks in 01/2019 and was found to be having severe OA of the knees and underwent injection of the knees with good improvement in knee pain for about 3-4 months. She in the interval was diagnosed with leukemia and was started on  Three different chemotherapy agents. She reports that she was placed in the hospital at [facility] in February for 3 days and she has had chemotherapy. She has the chemotherapy one week a month. She is to have a bone marrow aspirate soon and will  decide if a bone marrow transplant is  Indicated.  She sees Dr. Marshell Levan Hematologist/Oncologist with [facility] in [place] but comes to [facility] every Wednesday. If she needs a bone marrow transplant then she will go to [facility] in [place], [place].  She reports that injection of the knees was discussed with her oncologists before she came today and she was told that injection was okay and it would not be a risk due to her current situation.    Review of Systems   Constitutional: Positive for activity change. Negative for appetite change, chills, diaphoresis, fatigue, fever and unexpected weight change.  HENT: Negative.  Negative for congestion, dental problem, drooling, ear discharge, ear pain, facial swelling, hearing loss, mouth sores, nosebleeds, postnasal drip, rhinorrhea, sinus pressure, sinus pain, sneezing, sore throat, tinnitus, trouble swallowing and voice change.   Eyes: Negative for photophobia, pain, discharge, redness, itching and visual disturbance.  Respiratory: Negative for apnea, cough, choking, chest tightness, shortness of breath, wheezing and stridor.   Cardiovascular: Negative for chest pain, palpitations and leg swelling.  Gastrointestinal: Negative for abdominal distention, abdominal pain, anal bleeding, blood in stool, constipation, diarrhea, nausea, rectal pain and vomiting.  Endocrine: Negative for cold intolerance, heat intolerance, polydipsia, polyphagia and polyuria.  Genitourinary: Negative for difficulty urinating, dyspareunia, dysuria, enuresis, flank pain, frequency, genital sores and hematuria.  Musculoskeletal: Positive for arthralgias, gait problem and joint swelling. Negative for back pain, myalgias, neck pain and neck stiffness.  Skin: Negative for color change, pallor, rash and wound.  Allergic/Immunologic: Negative for environmental allergies, food allergies and immunocompromised state.  Neurological: Negative for dizziness, tremors, seizures, syncope, facial asymmetry, weakness, light-headedness, numbness and headaches.  Hematological: Negative for adenopathy. Does not bruise/bleed easily.  Psychiatric/Behavioral: Negative for agitation, behavioral problems, confusion, decreased concentration, dysphoric mood, hallucinations, self-injury, sleep disturbance and suicidal ideas. The patient is not nervous/anxious and is not hyperactive.      Objective: Vital Signs: BP (!) 90/51 (BP Location: Left Arm, Patient Position:  Sitting)   Pulse 98   Ht 5\' 6"  (1.676 m)   Wt 280 lb (127 kg)   BMI 45.19 kg/m   Physical Exam Constitutional:      Appearance: She is well-developed.  HENT:     Head: Normocephalic and atraumatic.  Eyes:     Pupils: Pupils are equal, round, and reactive to light.  Neck:     Musculoskeletal: Normal range of motion and neck supple.  Pulmonary:     Effort: Pulmonary effort is normal.     Breath sounds: Normal breath sounds.  Abdominal:     General: Bowel sounds are normal.     Palpations: Abdomen is soft.  Musculoskeletal:     Right knee: She exhibits effusion.     Left knee: She exhibits effusion.  Skin:    General: Skin is warm and dry.  Neurological:     Mental Status: She is alert and oriented to person, place, and time.  Psychiatric:        Behavior: Behavior normal.        Thought Content: Thought content normal.        Judgment: Judgment normal.     Right Knee Exam   Muscle Strength  The patient has normal right knee strength.  Tenderness  The patient is experiencing tenderness in the medial joint line, lateral joint line and patella.  Range of Motion  Extension: normal  Flexion:  120 abnormal   Tests  McMurray:  Medial - negative Lateral -  negative Varus: negative Valgus: negative Lachman:  Anterior - negative    Posterior - negative Drawer:  Anterior - negative    Posterior - negative Pivot shift: negative Patellar apprehension: negative  Other  Erythema: absent Scars: absent Sensation: normal Pulse: present Swelling: mild Effusion: effusion present   Left Knee Exam   Tenderness  The patient is experiencing tenderness in the lateral retinaculum, lateral joint line and medial joint line.  Range of Motion  Extension: normal  Flexion: 120   Tests  McMurray:  Medial - negative Lateral - negative Varus: negative Valgus: negative Lachman:  Anterior - negative    Posterior - negative Drawer:  Anterior - negative     Posterior - negative  Pivot shift: negative Patellar apprehension: negative  Other  Erythema: absent Scars: absent Sensation: normal Pulse: present Swelling: mild Effusion: effusion present      Specialty Comments:  No specialty comments available.  Imaging: No results found.   PMFS History: Patient Active Problem List   Diagnosis Date Noted  . Bilateral primary osteoarthritis of knee 03/22/2018  . Posterior tibial tendinitis of left leg 11/01/2017  . Posterior tibial tendinitis, left leg 10/02/2017  . Planovalgus deformity of foot, acquired, left 10/02/2017   Past Medical History:  Diagnosis Date  . Arthritis   . Bowel obstruction ([place])   . Cancer ([place]) 2015   Breast-right  . Diabetes mellitus without complication ([place])   . Fibromyalgia   . GERD (gastroesophageal reflux disease)   . Hypertension   . Presence of IVC filter 2010    No family history on file.  Past Surgical History:  Procedure Laterality Date  . ABDOMINAL HYSTERECTOMY    . ANKLE FUSION Left 11/01/2017   Procedure: SUBTALAR AND TALONAVICULAR FUSION LEFT FOOT;  Surgeon: Newt Minion, MD;  Location: [facility];  Service: Orthopedics;  Laterality: Left;  . BACK SURGERY  2007, 2008, 2010   x3   Social History   Occupational History  . Not on file  Tobacco Use  . Smoking status: Never Smoker  . Smokeless tobacco: Never Used  Substance and Sexual Activity  . Alcohol use: No  . Drug use: No  . Sexual activity: Not on file

## 2018-06-21 NOTE — Patient Instructions (Signed)
The main ways of treat osteoarthritis, that are found to be success. Weight loss helps to decrease pain. Exercise is important to maintaining cartilage and thickness and strengthening. NSAIDs like tylenol are meds decreasing the inflamation. Ice is okay  In afternoon and evening and hot shower in the am.  Tramadol for pain is reasonable for more severe pain. Return appointment for 5 weeks. If pain is improved and you wish to wait for more significant pain to try further injections please call us and we will Change return to a later date.

## 2018-07-24 ENCOUNTER — Other Ambulatory Visit: Payer: Self-pay | Admitting: Specialist

## 2018-07-24 DIAGNOSIS — G5603 Carpal tunnel syndrome, bilateral upper limbs: Secondary | ICD-10-CM

## 2018-07-24 DIAGNOSIS — M778 Other enthesopathies, not elsewhere classified: Secondary | ICD-10-CM

## 2018-07-24 DIAGNOSIS — M7062 Trochanteric bursitis, left hip: Secondary | ICD-10-CM

## 2018-07-24 DIAGNOSIS — M7061 Trochanteric bursitis, right hip: Secondary | ICD-10-CM

## 2018-07-24 DIAGNOSIS — M174 Other bilateral secondary osteoarthritis of knee: Secondary | ICD-10-CM

## 2018-07-24 MED ORDER — TRAMADOL HCL 50 MG PO TABS: 50.0000 mg | ORAL_TABLET | Freq: Two times a day (BID) | ORAL | 0 refills | Status: AC | PRN

## 2018-07-24 NOTE — Telephone Encounter (Signed)
Pt called in requesting a refill for Tramadol. Please have that sent to [facility] on [address] in [place].  [phone]

## 2018-07-25 NOTE — Telephone Encounter (Signed)
Called to [facility] in [place]

## 2018-08-01 ENCOUNTER — Ambulatory Visit: Payer: Medicare Other | Admitting: Specialist

## 2018-09-06 ENCOUNTER — Ambulatory Visit: Payer: Medicare Other | Admitting: Specialist

## 2018-09-08 DEATH — deceased
